# Patient Record
Sex: Male | Born: 1935 | Race: White | Hispanic: No | Smoking: Former smoker
Health system: Southern US, Community
[De-identification: ages and names within clinical notes are randomized; demographics above are authoritative.]

## PROBLEM LIST (undated history)

## (undated) DIAGNOSIS — I5030 Unspecified diastolic (congestive) heart failure: Secondary | ICD-10-CM

## (undated) DIAGNOSIS — E785 Hyperlipidemia, unspecified: Secondary | ICD-10-CM

## (undated) DIAGNOSIS — I219 Acute myocardial infarction, unspecified: Secondary | ICD-10-CM

## (undated) DIAGNOSIS — I10 Essential (primary) hypertension: Secondary | ICD-10-CM

## (undated) DIAGNOSIS — I2510 Atherosclerotic heart disease of native coronary artery without angina pectoris: Secondary | ICD-10-CM

## (undated) DIAGNOSIS — I259 Chronic ischemic heart disease, unspecified: Secondary | ICD-10-CM

## (undated) DIAGNOSIS — I779 Disorder of arteries and arterioles, unspecified: Secondary | ICD-10-CM

## (undated) DIAGNOSIS — K449 Diaphragmatic hernia without obstruction or gangrene: Secondary | ICD-10-CM

## (undated) HISTORY — DX: Unspecified diastolic (congestive) heart failure: I50.30

## (undated) HISTORY — DX: Essential (primary) hypertension: I10

## (undated) HISTORY — DX: Diaphragmatic hernia without obstruction or gangrene: K44.9

## (undated) HISTORY — PX: CORONARY ANGIOPLASTY WITH STENT PLACEMENT: SHX49

## (undated) HISTORY — DX: Hypercalcemia: E83.52

## (undated) HISTORY — PX: CARDIAC CATHETERIZATION: SHX172

## (undated) HISTORY — DX: Acute myocardial infarction, unspecified: I21.9

## (undated) HISTORY — DX: Atherosclerotic heart disease of native coronary artery without angina pectoris: I25.10

## (undated) HISTORY — DX: Chronic ischemic heart disease, unspecified: I25.9

## (undated) HISTORY — DX: Hyperlipidemia, unspecified: E78.5

---

## 2008-04-09 ENCOUNTER — Ambulatory Visit: Payer: Self-pay | Admitting: Internal Medicine

## 2014-04-15 DIAGNOSIS — I10 Essential (primary) hypertension: Secondary | ICD-10-CM | POA: Diagnosis not present

## 2014-04-15 DIAGNOSIS — E785 Hyperlipidemia, unspecified: Secondary | ICD-10-CM | POA: Diagnosis not present

## 2014-04-15 DIAGNOSIS — I214 Non-ST elevation (NSTEMI) myocardial infarction: Secondary | ICD-10-CM | POA: Diagnosis not present

## 2014-10-22 DIAGNOSIS — E785 Hyperlipidemia, unspecified: Secondary | ICD-10-CM | POA: Diagnosis not present

## 2014-10-22 DIAGNOSIS — I259 Chronic ischemic heart disease, unspecified: Secondary | ICD-10-CM | POA: Diagnosis not present

## 2014-10-22 DIAGNOSIS — I10 Essential (primary) hypertension: Secondary | ICD-10-CM | POA: Diagnosis not present

## 2014-10-29 DIAGNOSIS — L988 Other specified disorders of the skin and subcutaneous tissue: Secondary | ICD-10-CM | POA: Diagnosis not present

## 2014-11-10 DIAGNOSIS — D2262 Melanocytic nevi of left upper limb, including shoulder: Secondary | ICD-10-CM | POA: Diagnosis not present

## 2014-11-10 DIAGNOSIS — D2261 Melanocytic nevi of right upper limb, including shoulder: Secondary | ICD-10-CM | POA: Diagnosis not present

## 2014-11-10 DIAGNOSIS — D485 Neoplasm of uncertain behavior of skin: Secondary | ICD-10-CM | POA: Diagnosis not present

## 2014-11-10 DIAGNOSIS — D225 Melanocytic nevi of trunk: Secondary | ICD-10-CM | POA: Diagnosis not present

## 2014-11-10 DIAGNOSIS — C44519 Basal cell carcinoma of skin of other part of trunk: Secondary | ICD-10-CM | POA: Diagnosis not present

## 2014-11-13 ENCOUNTER — Ambulatory Visit (INDEPENDENT_AMBULATORY_CARE_PROVIDER_SITE_OTHER): Payer: Medicare Other | Admitting: Cardiovascular Disease

## 2014-11-13 ENCOUNTER — Encounter: Payer: Self-pay | Admitting: Cardiovascular Disease

## 2014-11-13 ENCOUNTER — Encounter (INDEPENDENT_AMBULATORY_CARE_PROVIDER_SITE_OTHER): Payer: Self-pay

## 2014-11-13 VITALS — BP 150/60 | HR 60 | Ht 66.0 in | Wt 173.5 lb

## 2014-11-13 DIAGNOSIS — R0989 Other specified symptoms and signs involving the circulatory and respiratory systems: Secondary | ICD-10-CM | POA: Diagnosis not present

## 2014-11-13 DIAGNOSIS — R079 Chest pain, unspecified: Secondary | ICD-10-CM | POA: Diagnosis not present

## 2014-11-13 DIAGNOSIS — I25119 Atherosclerotic heart disease of native coronary artery with unspecified angina pectoris: Secondary | ICD-10-CM | POA: Diagnosis not present

## 2014-11-13 DIAGNOSIS — I10 Essential (primary) hypertension: Secondary | ICD-10-CM | POA: Insufficient documentation

## 2014-11-13 DIAGNOSIS — E785 Hyperlipidemia, unspecified: Secondary | ICD-10-CM | POA: Insufficient documentation

## 2014-11-13 NOTE — Patient Instructions (Addendum)
Medication Instructions:  Your physician recommends that you continue on your current medications as directed. Please refer to the Current Medication list given to you today.   Labwork: none  Testing/Procedures: Your physician has requested that you have a carotid duplex. This test is an ultrasound of the carotid arteries in your neck. It looks at blood flow through these arteries that supply the brain with blood. Allow one hour for this exam. There are no restrictions or special instructions.    Follow-Up: Your physician wants you to follow-up in: six months with Dr. Arida.  You will receive a reminder letter in the mail two months in advance. If you don't receive a letter, please call our office to schedule the follow-up appointment.   Any Other Special Instructions Will Be Listed Below (If Applicable).   

## 2014-11-13 NOTE — Assessment & Plan Note (Signed)
Most recent lipid profile showed a total cholesterol of 159, HDL of 65, triglyceride of 83 and an LDL of 77. Continue treatment with simvastatin.

## 2014-11-13 NOTE — Progress Notes (Signed)
Primary care physician: Dr. Ola Spurr  HPI  This is a pleasant 81 year old male who is here today to establish cardiovascular care. He use to follow-up with Dr. Nehemiah Massed in the past. Records are not available through care everywhere. He has extensive history of ischemic heart disease with previous myocardial infarction at the age of 58. He reports having multiple PCI's done. Most recent cardiac cath that I have on record is from February 2010 which showed mild LAD disease, occluded mid left circumflex with good collaterals, and 85% stenosis in the distal RCA. Ejection fraction was mildly reduced. He underwent successful angioplasty on the distal right coronary artery with placement of a 3.0 x 18 mm vision bare-metal stent. He currently reports stable rare episodes of chest and jaw pain with activities. There has been no recent worsening. He has been somewhat depressed since the death of his wife last year. He has extensive family history of coronary artery disease. He quit smoking at the age of 32 after his myocardial infarction.  Not on File   No current outpatient prescriptions on file prior to visit.   No current facility-administered medications on file prior to visit.     Past Medical History  Diagnosis Date  . Hypertension   . Ischemic heart disease   . Hypercalcemia   . Hiatal hernia   . MI (myocardial infarction)   . Coronary artery disease     He reports having myocardial infarction at the age of 34. He reports having multiple PCI's most recently about 10 years ago. These records are not available.  . Hyperlipidemia   . Essential hypertension      Past Surgical History  Procedure Laterality Date  . Cardiac catheterization      [facility]  . Coronary angioplasty with stent placement       Family History  Problem Relation Age of Onset  . Hypertension Father      Social History   Social History  . Marital Status: Married    Spouse Name: N/A  . Number of Children:  N/A  . Years of Education: N/A   Occupational History  . Not on file.   Social History Main Topics  . Smoking status: Former Smoker    Types: Cigarettes    Quit date: 03/25/1982  . Smokeless tobacco: Not on file  . Alcohol Use: Yes     Comment: occas.   . Drug Use: No  . Sexual Activity: Not on file   Other Topics Concern  . Not on file   Social History Narrative  . No narrative on file     ROS A 10 point review of system was performed. It is negative other than that mentioned in the history of present illness.   PHYSICAL EXAM   BP 150/60 mmHg  Pulse 60  Ht 5\' 6"  (1.676 m)  Wt 173 lb 8 oz (78.699 kg)  BMI 28.02 kg/m2 Constitutional: He is oriented to person, place, and time. He appears well-developed and well-nourished. No distress.  HENT: No nasal discharge.  Head: Normocephalic and atraumatic.  Eyes: Pupils are equal and round.  No discharge. Neck: Normal range of motion. Neck supple. No JVD present. No thyromegaly present. Right carotid bruit Cardiovascular: Normal rate, regular rhythm, normal heart sounds. Exam reveals no gallop and no friction rub. No murmur heard.  Pulmonary/Chest: Effort normal and breath sounds normal. No stridor. No respiratory distress. He has no wheezes. He has no rales. He exhibits no tenderness.  Abdominal: Soft. Bowel sounds  are normal. He exhibits no distension. There is no tenderness. There is no rebound and no guarding.  Musculoskeletal: Normal range of motion. He exhibits no edema and no tenderness.  Neurological: He is alert and oriented to person, place, and time. Coordination normal.  Skin: Skin is warm and dry. No rash noted. He is not diaphoretic. No erythema. No pallor.  Psychiatric: He has a normal mood and affect. His behavior is normal. Judgment and thought content normal.       EKG: Normal sinus rhythm with sinus arrhythmia and borderline LVH.   ASSESSMENT AND PLAN

## 2014-11-13 NOTE — Assessment & Plan Note (Signed)
Blood pressure is mildly elevated. I might consider switching atenolol to carvedilol.

## 2014-11-13 NOTE — Assessment & Plan Note (Signed)
The patient has known history of coronary artery disease currently with very mild angina which is being managed medically. Given that he had no recent worsening of his symptoms, I recommend continued medical therapy.

## 2014-11-13 NOTE — Assessment & Plan Note (Signed)
He reports known history of carotid disease with no recent evaluation. I requested carotid Doppler.

## 2014-11-22 DIAGNOSIS — Z23 Encounter for immunization: Secondary | ICD-10-CM | POA: Diagnosis not present

## 2014-11-25 ENCOUNTER — Ambulatory Visit (INDEPENDENT_AMBULATORY_CARE_PROVIDER_SITE_OTHER): Payer: Medicare Other

## 2014-11-25 DIAGNOSIS — R0989 Other specified symptoms and signs involving the circulatory and respiratory systems: Secondary | ICD-10-CM | POA: Diagnosis not present

## 2014-11-26 ENCOUNTER — Other Ambulatory Visit: Payer: Self-pay

## 2014-11-26 DIAGNOSIS — I6523 Occlusion and stenosis of bilateral carotid arteries: Secondary | ICD-10-CM

## 2014-12-01 DIAGNOSIS — L905 Scar conditions and fibrosis of skin: Secondary | ICD-10-CM | POA: Diagnosis not present

## 2014-12-01 DIAGNOSIS — C44519 Basal cell carcinoma of skin of other part of trunk: Secondary | ICD-10-CM | POA: Diagnosis not present

## 2015-01-05 DIAGNOSIS — J0110 Acute frontal sinusitis, unspecified: Secondary | ICD-10-CM | POA: Diagnosis not present

## 2015-01-05 DIAGNOSIS — R0982 Postnasal drip: Secondary | ICD-10-CM | POA: Diagnosis not present

## 2015-01-05 DIAGNOSIS — H66002 Acute suppurative otitis media without spontaneous rupture of ear drum, left ear: Secondary | ICD-10-CM | POA: Diagnosis not present

## 2015-04-27 DIAGNOSIS — I214 Non-ST elevation (NSTEMI) myocardial infarction: Secondary | ICD-10-CM | POA: Diagnosis not present

## 2015-04-27 DIAGNOSIS — I259 Chronic ischemic heart disease, unspecified: Secondary | ICD-10-CM | POA: Diagnosis not present

## 2015-04-27 DIAGNOSIS — E785 Hyperlipidemia, unspecified: Secondary | ICD-10-CM | POA: Diagnosis not present

## 2015-04-27 DIAGNOSIS — I10 Essential (primary) hypertension: Secondary | ICD-10-CM | POA: Diagnosis not present

## 2015-04-28 DIAGNOSIS — Z23 Encounter for immunization: Secondary | ICD-10-CM | POA: Diagnosis not present

## 2015-06-17 ENCOUNTER — Ambulatory Visit: Payer: Medicare Other

## 2015-06-17 DIAGNOSIS — I6523 Occlusion and stenosis of bilateral carotid arteries: Secondary | ICD-10-CM

## 2015-06-22 ENCOUNTER — Other Ambulatory Visit: Payer: Self-pay

## 2015-06-22 DIAGNOSIS — I6523 Occlusion and stenosis of bilateral carotid arteries: Secondary | ICD-10-CM

## 2015-07-16 ENCOUNTER — Encounter: Payer: Self-pay | Admitting: Cardiovascular Disease

## 2015-07-16 ENCOUNTER — Ambulatory Visit (INDEPENDENT_AMBULATORY_CARE_PROVIDER_SITE_OTHER): Payer: Medicare Other | Admitting: Cardiovascular Disease

## 2015-07-16 VITALS — BP 140/62 | HR 55 | Ht 66.0 in | Wt 174.8 lb

## 2015-07-16 DIAGNOSIS — I25119 Atherosclerotic heart disease of native coronary artery with unspecified angina pectoris: Secondary | ICD-10-CM

## 2015-07-16 DIAGNOSIS — I10 Essential (primary) hypertension: Secondary | ICD-10-CM | POA: Diagnosis not present

## 2015-07-16 DIAGNOSIS — I6523 Occlusion and stenosis of bilateral carotid arteries: Secondary | ICD-10-CM

## 2015-07-16 NOTE — Patient Instructions (Signed)
Medication Instructions: Continue same medications.   Labwork: None.   Procedures/Testing: None.   Follow-Up: 6 months with Dr. Arida.   Any Additional Special Instructions Will Be Listed Below (If Applicable).     If you need a refill on your cardiac medications before your next appointment, please call your pharmacy.   

## 2015-07-16 NOTE — Progress Notes (Signed)
Cardiology Office Note   Date:  07/16/2015   ID:  Anthony Johns, DOB 1927-04-22, MRN [id number]  PCP:  Anthony Ramsay, MD  Cardiologist:   Kathlyn Sacramento, MD   No chief complaint on file.     History of Present Illness: Anthony Johns is a 82 y.o. male who presents for a follow-up visit regarding coronary artery disease.  He has extensive history of ischemic heart disease with previous myocardial infarction at the age of 47. He had multiple PCI's done. Most recent cardiac cath that I have on record is from February 2010 which showed mild LAD disease, occluded mid left circumflex with good collaterals, and 85% stenosis in the distal RCA. Ejection fraction was mildly reduced. He underwent successful angioplasty on the distal right coronary artery with placement of a 3.0 x 18 mm vision bare-metal stent. He currently reports stable episodes of chest and jaw pain with activities. There has been no recent worsening. He has been somewhat depressed since the death of his wife last year. He has extensive family history of coronary artery disease. He quit smoking at the age of 78 after his myocardial infarction. During his last visit, he was noted to have right carotid bruit. Carotid Doppler showed 60-79% left ICA stenosis. No previous stroke.    Past Medical History  Diagnosis Date  . Hypertension   . Ischemic heart disease   . Hypercalcemia   . Hiatal hernia   . MI (myocardial infarction)   . Coronary artery disease     He reports having myocardial infarction at the age of 69. He reports having multiple PCI's most recently about 10 years ago. These records are not available.  . Hyperlipidemia   . Essential hypertension     Past Surgical History  Procedure Laterality Date  . Cardiac catheterization      [facility]  . Coronary angioplasty with stent placement       Current Outpatient Prescriptions  Medication Sig Dispense Refill  . amLODipine (NORVASC) 10 MG tablet Take 10 mg  by mouth daily.     Marland Kitchen aspirin EC 81 MG tablet Take 81 mg by mouth daily.     Marland Kitchen atenolol (TENORMIN) 25 MG tablet Take 25 mg by mouth daily.     . clopidogrel (PLAVIX) 75 MG tablet Take 75 mg by mouth daily.     Marland Kitchen losartan (COZAAR) 100 MG tablet Take 100 mg by mouth daily.     . magnesium oxide (MAG-OX) 400 MG tablet Take 400 mg by mouth daily.     . nitroGLYCERIN (NITROSTAT) 0.4 MG SL tablet Place 0.4 mg under the tongue every 5 (five) minutes as needed.     Marland Kitchen omeprazole (PRILOSEC) 20 MG capsule TAKE 1 CAPSULE DAILY    . simvastatin (ZOCOR) 20 MG tablet Take 20 mg by mouth daily at 6 PM.      No current facility-administered medications for this visit.    Allergies:   Review of patient's allergies indicates not on file.    Social History:  The patient  reports that he quit smoking about 33 years ago. His smoking use included Cigarettes. He does not have any smokeless tobacco history on file. He reports that he drinks alcohol. He reports that he does not use illicit drugs.   Family History:  The patient's family history includes Hypertension in his father.    ROS:  Please see the history of present illness.   Otherwise, review of systems are positive  for none.   All other systems are reviewed and negative.    PHYSICAL EXAM: VS:  There were no vitals taken for this visit. , BMI There is no weight on file to calculate BMI. GEN: Well nourished, well developed, in no acute distress HEENT: normal Neck: no JVD, or masses . Right carotid bruit. Cardiac: RRR; no  rubs, or gallops,no edema . One out of 6 systolic murmur at the left sternal border Respiratory:  clear to auscultation bilaterally, normal work of breathing GI: soft, nontender, nondistended, + BS MS: no deformity or atrophy Skin: warm and dry, no rash Neuro:  Strength and sensation are intact Psych: euthymic mood, full affect   EKG:  EKG is ordered today. The ekg ordered today demonstrates sinus bradycardia with  PAC   Recent Labs: No results found for requested labs within last 365 days.    Lipid Panel No results found for: CHOL, TRIG, HDL, CHOLHDL, VLDL, LDLCALC, LDLDIRECT    Wt Readings from Last 3 Encounters:  11/13/14 173 lb 8 oz (78.699 kg)         ASSESSMENT AND PLAN:  1.  Coronary artery disease involving native coronary arteries with stable angina: The patient continues to have stable class II angina with no recent worsening. Continue medical therapy.  2. Left carotid stenosis: Moderate. Repeat study and April 2018.  3. Essential hypertension: Blood pressure is mildly elevated. If bradycardia continues, we can consider switching atenolol to carvedilol.  4. Hyperlipidemia: Continue treatment with simvastatin. Most recent lipid profile in March showed an LDL of 72.   Disposition:   FU with me in 6 months  Signed,  Kathlyn Sacramento, MD  07/16/2015 2:04 PM    [place]

## 2015-07-24 ENCOUNTER — Emergency Department: Payer: Medicare Other

## 2015-07-24 ENCOUNTER — Emergency Department
Admission: EM | Admit: 2015-07-24 | Discharge: 2015-07-24 | Disposition: A | Discharge status: Home / Self Care | Payer: Medicare Other | Attending: Emergency Medicine | Admitting: Emergency Medicine

## 2015-07-24 ENCOUNTER — Encounter: Payer: Self-pay | Admitting: Emergency Medicine

## 2015-07-24 DIAGNOSIS — Z79899 Other long term (current) drug therapy: Secondary | ICD-10-CM | POA: Diagnosis not present

## 2015-07-24 DIAGNOSIS — S52135A Nondisplaced fracture of neck of left radius, initial encounter for closed fracture: Secondary | ICD-10-CM

## 2015-07-24 DIAGNOSIS — Y999 Unspecified external cause status: Secondary | ICD-10-CM | POA: Insufficient documentation

## 2015-07-24 DIAGNOSIS — I252 Old myocardial infarction: Secondary | ICD-10-CM | POA: Diagnosis not present

## 2015-07-24 DIAGNOSIS — S299XXA Unspecified injury of thorax, initial encounter: Secondary | ICD-10-CM | POA: Diagnosis not present

## 2015-07-24 DIAGNOSIS — Z8679 Personal history of other diseases of the circulatory system: Secondary | ICD-10-CM | POA: Diagnosis not present

## 2015-07-24 DIAGNOSIS — E785 Hyperlipidemia, unspecified: Secondary | ICD-10-CM | POA: Diagnosis not present

## 2015-07-24 DIAGNOSIS — Y939 Activity, unspecified: Secondary | ICD-10-CM | POA: Diagnosis not present

## 2015-07-24 DIAGNOSIS — Z7982 Long term (current) use of aspirin: Secondary | ICD-10-CM | POA: Diagnosis not present

## 2015-07-24 DIAGNOSIS — W010XXA Fall on same level from slipping, tripping and stumbling without subsequent striking against object, initial encounter: Secondary | ICD-10-CM | POA: Insufficient documentation

## 2015-07-24 DIAGNOSIS — S20212A Contusion of left front wall of thorax, initial encounter: Secondary | ICD-10-CM | POA: Diagnosis not present

## 2015-07-24 DIAGNOSIS — S6992XA Unspecified injury of left wrist, hand and finger(s), initial encounter: Secondary | ICD-10-CM | POA: Diagnosis present

## 2015-07-24 DIAGNOSIS — S52132A Displaced fracture of neck of left radius, initial encounter for closed fracture: Secondary | ICD-10-CM | POA: Diagnosis not present

## 2015-07-24 DIAGNOSIS — I10 Essential (primary) hypertension: Secondary | ICD-10-CM | POA: Diagnosis not present

## 2015-07-24 DIAGNOSIS — S5002XA Contusion of left elbow, initial encounter: Secondary | ICD-10-CM | POA: Diagnosis not present

## 2015-07-24 DIAGNOSIS — S5012XA Contusion of left forearm, initial encounter: Secondary | ICD-10-CM

## 2015-07-24 DIAGNOSIS — Y9289 Other specified places as the place of occurrence of the external cause: Secondary | ICD-10-CM | POA: Insufficient documentation

## 2015-07-24 DIAGNOSIS — I25119 Atherosclerotic heart disease of native coronary artery with unspecified angina pectoris: Secondary | ICD-10-CM | POA: Diagnosis not present

## 2015-07-24 DIAGNOSIS — M25512 Pain in left shoulder: Secondary | ICD-10-CM | POA: Diagnosis not present

## 2015-07-24 DIAGNOSIS — W19XXXA Unspecified fall, initial encounter: Secondary | ICD-10-CM

## 2015-07-24 MED ORDER — HYDROCODONE-ACETAMINOPHEN 5-325 MG PO TABS: 1.0000 | ORAL_TABLET | Freq: Four times a day (QID) | ORAL | Status: DC | PRN

## 2015-07-24 NOTE — ED Provider Notes (Signed)
[facility] Emergency Department Provider Note  ____________________________________________  Time seen: Approximately 4:43 PM  I have reviewed the triage vital signs and the nursing notes.   HISTORY  Chief Complaint Fall and Shoulder Pain    HPI Anthony Johns is a 82 y.o. male, NAD, who presents to the emergency department after a fall earlier today. Patient states that he was weed-eating on a hill around 11am today when he lost footing and fell onto his left shoulder and arm. Denies hitting head nor LOC. Admits to pain in left shoulder, upper arm, lower arm, and left chest wall as well as bruising of left arm. Denies active bleeding, any open wounds, chest pain, shortness of breath, or abdominal pain. States that the fall was mechanical in nature and denies any visual changes, numbness, weakness, tingling but contributed to the fall.   Past Medical History  Diagnosis Date  . Hypertension   . Ischemic heart disease   . Hypercalcemia   . Hiatal hernia   . MI (myocardial infarction) ([place])   . Coronary artery disease     He reports having myocardial infarction at the age of 13. He reports having multiple PCI's most recently about 10 years ago. These records are not available.  . Hyperlipidemia   . Essential hypertension     Patient Active Problem List   Diagnosis Date Noted  . Coronary artery disease involving native coronary artery with angina pectoris ([place]) 11/13/2014  . Right carotid bruit 11/13/2014  . Hyperlipidemia   . Essential hypertension     Past Surgical History  Procedure Laterality Date  . Cardiac catheterization      [facility]  . Coronary angioplasty with stent placement      Current Outpatient Rx  Name  Route  Sig  Dispense  Refill  . amLODipine (NORVASC) 10 MG tablet   Oral   Take 10 mg by mouth daily.          Marland Kitchen aspirin EC 81 MG tablet   Oral   Take 81 mg by mouth daily.          Marland Kitchen atenolol (TENORMIN) 25 MG tablet    Oral   Take 25 mg by mouth daily.          . clopidogrel (PLAVIX) 75 MG tablet   Oral   Take 75 mg by mouth daily.          Marland Kitchen HYDROcodone-acetaminophen (NORCO) 5-325 MG tablet   Oral   Take 1 tablet by mouth every 6 (six) hours as needed for severe pain.   12 tablet   0   . losartan (COZAAR) 100 MG tablet   Oral   Take 100 mg by mouth daily.          . magnesium oxide (MAG-OX) 400 MG tablet   Oral   Take 400 mg by mouth daily.          . nitroGLYCERIN (NITROSTAT) 0.4 MG SL tablet   Sublingual   Place 0.4 mg under the tongue every 5 (five) minutes as needed.          Marland Kitchen omeprazole (PRILOSEC) 20 MG capsule      TAKE 1 CAPSULE DAILY         . simvastatin (ZOCOR) 20 MG tablet   Oral   Take 20 mg by mouth daily at 6 PM.            Allergies Review of patient's allergies indicates no known allergies.  Family History  Problem Relation Age of Onset  . Hypertension Father     Social History Social History  Substance Use Topics  . Smoking status: Former Smoker    Types: Cigarettes    Quit date: 03/25/1982  . Smokeless tobacco: None  . Alcohol Use: Yes     Comment: occas.      Review of Systems  Constitutional: No fever/chills, fatigue Eyes: No visual changes. Cardiovascular: No chest pain. Respiratory:  No cough. No shortness of breath. No wheezing.  Gastrointestinal: No abdominal pain.  No nausea, vomiting.   Musculoskeletal: Pain at left shoulder, left upper arm, left lower arm, left hand, and left chest wall.  Negative for back, neck pain.  Skin: Bruising about left elbow and forearm. Negative for rash, redness, swelling, open wounds, lacerations or skin sores. Neurological: Negative for headaches, focal weakness or numbness. 10-point ROS otherwise negative.  ____________________________________________   PHYSICAL EXAM:  VITAL SIGNS: ED Triage Vitals  Enc Vitals Group     BP 07/24/15 1547 141/80 mmHg     Pulse Rate 07/24/15 1547 55      Resp 07/24/15 1547 18     Temp 07/24/15 1547 98.4 F (36.9 C)     Temp Source 07/24/15 1547 Oral     SpO2 07/24/15 1547 99 %     Weight 07/24/15 1547 174 lb (78.926 kg)     Height 07/24/15 1547 5\' 6"  (1.676 m)     Head Cir --      Peak Flow --      Pain Score 07/24/15 1546 8     Pain Loc --      Pain Edu? --      Excl. in [place]? --      Constitutional: Alert and oriented. Well appearing and in no acute distress. Eyes: Conjunctivae are normal. Head: Atraumatic. Neck: Supple with full range of motion. Hematological/Lymphatic/Immunilogical: No cervical lymphadenopathy. Cardiovascular: Normal rate, regular rhythm. Grossly normal heart sounds. Good peripheral circulation with 2+ pulses noted in the left upper extremity. Respiratory: Normal respiratory effort without tachypnea or retractions. Lungs CTAB with breath sounds noted in all lung fields. Musculoskeletal: Mild tenderness to palpation over the left anterior chest lateral to the nipple line. Patient with decreased ability to supinate the left forearm and complains of pain at the proximal forearm. Patient is able to fully pronate the left forearm without pain. Patient is able to fully flex the left elbow without pain but is unable to fully extend the elbow without pain. Full range of motion of left shoulder without pain.  Neurologic:  Normal speech and language. No gross focal neurologic deficits are appreciated. Gait and posture are normal. Skin:  Diffuse bruising is noted about the left elbow and proximal left forearm without any open wounds or lesions. Skin is warm, dry and intact. No rash noted. Psychiatric: Mood and affect are normal. Speech and behavior are normal. Patient exhibits appropriate insight and judgement.   ____________________________________________   LABS  None ____________________________________________  EKG  None ____________________________________________  RADIOLOGY I have personally viewed and  evaluated these images (plain radiographs) as part of my medical decision making, as well as reviewing the written report by the radiologist.  Dg Ribs Unilateral W/chest Left  07/24/2015  CLINICAL DATA:  Fall. EXAM: LEFT RIBS AND CHEST - 3+ VIEW COMPARISON:  None FINDINGS: No fracture or other bone lesions are seen involving the ribs. There is no evidence of pneumothorax or pleural effusion. Both lungs are clear. Heart  size and mediastinal contours are within normal limits. IMPRESSION: Negative. Electronically Signed   By: Kerby Moors M.D.   On: 07/24/2015 17:40   Dg Elbow 2 Views Left  07/24/2015  CLINICAL DATA:  Patient reports he fell today onto his left side while doing yard work. Pain in left shoulder that radiates distally into distal forearm. No previous injuries reported. EXAM: LEFT ELBOW - 2 VIEW COMPARISON:  None. FINDINGS: There is a fracture through the left radial neck. Associated joint effusion. No subluxation or dislocation. No additional acute bony abnormality. IMPRESSION: Left radial neck fracture. Electronically Signed   By: Rolm Baptise M.D.   On: 07/24/2015 17:38   Dg Forearm Left  07/24/2015  CLINICAL DATA:  Patient reports he fell today onto his left side while doing yard work. Pain in left shoulder that radiates distally into distal forearm. No previous injuries reported. EXAM: LEFT FOREARM - 2 VIEW COMPARISON:  None. FINDINGS: Fracture again noted through the left radial neck. This is better seen on the elbow series. Associated left elbow joint effusion. No additional acute bony abnormality. IMPRESSION: Left radial neck fracture. Electronically Signed   By: Rolm Baptise M.D.   On: 07/24/2015 17:41   Dg Shoulder Left  07/24/2015  CLINICAL DATA:  Fall today onto left side doing yard work. Left shoulder pain. EXAM: LEFT SHOULDER - 2+ VIEW COMPARISON:  None FINDINGS: Mild degenerative changes in the left AC joint. Glenohumeral joint is intact. No acute bony abnormality. Specifically,  no fracture, subluxation, or dislocation. Soft tissues are intact. IMPRESSION: No acute bony abnormality. Electronically Signed   By: Rolm Baptise M.D.   On: 07/24/2015 17:38    ____________________________________________    PROCEDURES  Procedure(s) performed: None    Medications - No data to display   ____________________________________________   INITIAL IMPRESSION / ASSESSMENT AND PLAN / ED COURSE  Pertinent imaging results that were available during my care of the patient were reviewed by me and considered in my medical decision making (see chart for details).  Patient's diagnosis is consistent with a closed nondisplaced fracture of the neck of the left radius, contusion of left elbow and forearm, chest wall contusion due to fall. Patient was placed in a sugar tong splint and arm sling for comfort care. Patient will be discharged home with prescriptions for Norco to use as needed for severe pain. Patient is to follow up with Dr. Sabra Heck in orthopedics in 3 days for recheck. Patient is given ED precautions to return to the ED for any worsening or new symptoms.    ____________________________________________  FINAL CLINICAL IMPRESSION(S) / ED DIAGNOSES  Final diagnoses:  Closed nondisplaced fracture of neck of left radius, initial encounter  Contusion of left elbow and forearm, initial encounter  Fall, initial encounter  Chest wall contusion, left, initial encounter      NEW MEDICATIONS STARTED DURING THIS VISIT:  New Prescriptions   HYDROCODONE-ACETAMINOPHEN (NORCO) 5-325 MG TABLET    Take 1 tablet by mouth every 6 (six) hours as needed for severe pain.         Braxton Feathers, PA-C 07/24/15 1803  Lisa Roca, MD 07/24/15 1806

## 2015-07-24 NOTE — ED Notes (Signed)
See triage note  States he fell   Landed on left shoulder this am  Having pain to left shoulder and upper chest .Also, he has additional complaints of pain to left wrist area  No swelling noted but having increased pain with movement.  Positive pulses and good sensation

## 2015-07-24 NOTE — Discharge Instructions (Signed)
Chest Contusion A chest contusion is a deep bruise on your chest area. Contusions are the result of an injury that caused bleeding under the skin. A chest contusion may involve bruising of the skin, muscles, or ribs. The contusion may turn blue, purple, or yellow. Minor injuries will give you a painless contusion, but more severe contusions may stay painful and swollen for a few weeks. CAUSES  A contusion is usually caused by a blow, trauma, or direct force to an area of the body. SYMPTOMS   Swelling and redness of the injured area.  Discoloration of the injured area.  Tenderness and soreness of the injured area.  Pain. DIAGNOSIS  The diagnosis can be made by taking a history and performing a physical exam. An X-ray, CT scan, or MRI may be needed to determine if there were any associated injuries, such as broken bones (fractures) or internal injuries. TREATMENT  Often, the best treatment for a chest contusion is resting, icing, and applying cold compresses to the injured area. Deep breathing exercises may be recommended to reduce the risk of pneumonia. Over-the-counter medicines may also be recommended for pain control. HOME CARE INSTRUCTIONS   Put ice on the injured area.  Put ice in a plastic bag.  Place a towel between your skin and the bag.  Leave the ice on for 15-20 minutes, 03-04 times a day.  Only take over-the-counter or prescription medicines as directed by your caregiver. Your caregiver may recommend avoiding anti-inflammatory medicines (aspirin, ibuprofen, and naproxen) for 48 hours because these medicines may increase bruising.  Rest the injured area.  Perform deep-breathing exercises as directed by your caregiver.  Stop smoking if you smoke.  Do not lift objects over 5 pounds (2.3 kg) for 3 days or longer if recommended by your caregiver. SEEK IMMEDIATE MEDICAL CARE IF:   You have increased bruising or swelling.  You have pain that is getting worse.  You have  difficulty breathing.  You have dizziness, weakness, or fainting.  You have blood in your urine or stool.  You cough up or vomit blood.  Your swelling or pain is not relieved with medicines. MAKE SURE YOU:   Understand these instructions.  Will watch your condition.  Will get help right away if you are not doing well or get worse.   This information is not intended to replace advice given to you by your health care provider. Make sure you discuss any questions you have with your health care provider.   Document Released: 11/02/2000 Document Revised: 11/02/2011 Document Reviewed: 08/01/2011 Elsevier Interactive Patient Education 2016 [employer].  Cryotherapy Cryotherapy is when you put ice on your injury. Ice helps lessen pain and puffiness (swelling) after an injury. Ice works the best when you start using it in the first 24 to 48 hours after an injury. HOME CARE  Put a dry or damp towel between the ice pack and your skin.  You may press gently on the ice pack.  Leave the ice on for no more than 10 to 20 minutes at a time.  Check your skin after 5 minutes to make sure your skin is okay.  Rest at least 20 minutes between ice pack uses.  Stop using ice when your skin loses feeling (numbness).  Do not use ice on someone who cannot tell you when it hurts. This includes small children and people with memory problems (dementia). GET HELP RIGHT AWAY IF:  You have white spots on your skin.  Your skin  turns blue or pale.  Your skin feels waxy or hard.  Your puffiness gets worse. MAKE SURE YOU:   Understand these instructions.  Will watch your condition.  Will get help right away if you are not doing well or get worse.   This information is not intended to replace advice given to you by your health care provider. Make sure you discuss any questions you have with your health care provider.   Document Released: 07/27/2007 Document Revised: 05/02/2011 Document  Reviewed: 09/30/2010 Elsevier Interactive Patient Education 2016 [employer]  Radial Fracture A radial fracture is a break in the radius bone, which is the long bone of the forearm that is on the same side as your thumb. Your forearm is the part of your arm that is between your elbow and your wrist. It is made up of two bones: the radius and the ulna. Most radial fractures occur near the wrist (distal radialfracture) or near the elbow (radial head fracture). A distal radial fracture is the most common type of broken arm. This fracture usually occurs about an inch above the wrist. Fractures of the middle part of the bone are less common. CAUSES  Falling with your arm outstretched is the most common cause of a radial fracture. Other causes include:  Car accidents.  Bike accidents.  A direct blow to the middle part of the radius. RISK FACTORS  You may be at greater risk for a distal radial fracture if you are 32 years of age or older.  You may be at greater risk for a radial head fracture if you are:  Male.  5-4 years old.  You may be at a greater risk for all types of radial fractures if you have a condition that causes your bones to be weak or thin (osteoporosis). SIGNS AND SYMPTOMS A radial fracture causes pain immediately after the injury. Other signs and symptoms include:  An abnormal bend or bump in your arm (deformity).  Swelling.  Bruising.  Numbness or tingling.  Tenderness.  Limited movement. DIAGNOSIS  Your health care provider may diagnose a radial fracture based on:  Your symptoms.  Your medical history, including any recent injury.  A physical exam. Your health care provider will look for any deformity and feel for tenderness over the break. Your health care provider will also check whether the bone is out of place.  An X-ray exam to confirm the diagnosis and learn more about the type of fracture. TREATMENT The goals of treatment are to get the  bone in proper position for healing and to keep it from moving so it will heal over time. Your treatment will depend on many factors, especially the type of fracture that you have.  If the fractured bone:  Is in the correct position (nondisplaced), you may only need to wear a cast or a splint.  Has a slightly displaced fracture, you may need to have the bones moved back into place manually (closed reduction) before the splint or cast is put on.  You may have a temporary splint before you have a plaster cast. The splint allows room for some swelling. After a few days, a cast can replace the splint.  You may have to wear the cast for about 6 weeks or as directed by your health care provider.  The cast may be changed after about 3 weeks or as directed by your health care provider.  After your cast is taken off, you may need physical therapy to regain  full movement in your wrist or elbow.  You may need emergency surgery if you have:  A fractured bone that is out of position (displaced).  A fracture with multiple fragments (comminuted fracture).  A fracture that breaks the skin (open fracture). This type of fracture may require surgical wires, plates, or screws to hold the bone in place.  You may have X-rays every couple of weeks to check on your healing. HOME CARE INSTRUCTIONS  Keep the injured arm above the level of your heart while you are sitting or lying down. This helps to reduce swelling and pain.  Apply ice to the injured area:  Put ice in a plastic bag.  Place a towel between your skin and the bag.  Leave the ice on for 20 minutes, 2-3 times per day.  Move your fingers often to avoid stiffness and to minimize swelling.  If you have a plaster or fiberglass cast:  Do not try to scratch the skin under the cast using sharp or pointed objects.  Check the skin around the cast every day. You may put lotion on any red or sore areas.  Keep your cast dry and clean.  If you  have a plaster splint:  Wear the splint as directed.  Loosen the elastic around the splint if your fingers become numb and tingle, or if they turn cold and blue.  Do not put pressure on any part of your cast until it is fully hardened. Rest your cast only on a pillow for the first 24 hours.  Protect your cast or splint while bathing or showering, as directed by your health care provider. Do not put your cast or splint into water.  Take medicines only as directed by your health care provider.  Return to activities, such as sports, as directed by your health care provider. Ask your health care provider what activities are safe for you.  Keep all follow-up visits as directed by your health care provider. This is important. SEEK MEDICAL CARE IF:  Your pain medicine is not helping.  Your cast gets damaged or it breaks.  Your cast becomes loose.  Your cast gets wet.  You have more severe pain or swelling than you did before the cast.  You have severe pain when stretching your fingers.  You continue to have pain or stiffness in your elbow or your wrist after your cast is taken off. SEEK IMMEDIATE MEDICAL CARE IF:  You cannot move your fingers.  You lose feeling in your fingers or your hand.  Your hand or your fingers turn cold and pale or blue.  You notice a bad smell coming from your cast.  You have drainage from underneath your cast.  You have new stains from blood or drainage seeping through your cast.   This information is not intended to replace advice given to you by your health care provider. Make sure you discuss any questions you have with your health care provider.   Document Released: 07/21/2005 Document Revised: 02/28/2014 Document Reviewed: 08/02/2013 Elsevier Interactive Patient Education [employer].

## 2015-07-24 NOTE — ED Notes (Addendum)
Pt states he was weed eating on a hill today when he tripped on the ground and fell onto left shoulder. Denies hitting his head and did not have any LOC.  Pt does have bruising noted to both arms and hands.  Pt is on Plavix and ASA daily. No active bleeding at this time. AMbulatory in triage without difficulty. Injury happened around 1130 today Pt also c/o left wrist pain and decreased ROM.

## 2015-07-27 DIAGNOSIS — S52135A Nondisplaced fracture of neck of left radius, initial encounter for closed fracture: Secondary | ICD-10-CM | POA: Diagnosis not present

## 2015-08-10 DIAGNOSIS — R0781 Pleurodynia: Secondary | ICD-10-CM | POA: Diagnosis not present

## 2015-08-10 DIAGNOSIS — M25522 Pain in left elbow: Secondary | ICD-10-CM | POA: Diagnosis not present

## 2015-11-02 DIAGNOSIS — K582 Mixed irritable bowel syndrome: Secondary | ICD-10-CM | POA: Diagnosis not present

## 2015-11-02 DIAGNOSIS — I259 Chronic ischemic heart disease, unspecified: Secondary | ICD-10-CM | POA: Diagnosis not present

## 2015-11-02 DIAGNOSIS — I214 Non-ST elevation (NSTEMI) myocardial infarction: Secondary | ICD-10-CM | POA: Diagnosis not present

## 2015-11-02 DIAGNOSIS — I10 Essential (primary) hypertension: Secondary | ICD-10-CM | POA: Diagnosis not present

## 2015-11-02 DIAGNOSIS — E785 Hyperlipidemia, unspecified: Secondary | ICD-10-CM | POA: Diagnosis not present

## 2015-12-01 DIAGNOSIS — Z23 Encounter for immunization: Secondary | ICD-10-CM | POA: Diagnosis not present

## 2015-12-14 DIAGNOSIS — Z961 Presence of intraocular lens: Secondary | ICD-10-CM | POA: Diagnosis not present

## 2016-01-12 ENCOUNTER — Ambulatory Visit (INDEPENDENT_AMBULATORY_CARE_PROVIDER_SITE_OTHER): Payer: Medicare Other | Admitting: Cardiovascular Disease

## 2016-01-12 ENCOUNTER — Encounter: Payer: Self-pay | Admitting: Cardiovascular Disease

## 2016-01-12 VITALS — BP 160/70 | HR 62 | Ht 66.0 in | Wt 179.5 lb

## 2016-01-12 DIAGNOSIS — I25119 Atherosclerotic heart disease of native coronary artery with unspecified angina pectoris: Secondary | ICD-10-CM

## 2016-01-12 DIAGNOSIS — E7800 Pure hypercholesterolemia, unspecified: Secondary | ICD-10-CM

## 2016-01-12 DIAGNOSIS — I209 Angina pectoris, unspecified: Secondary | ICD-10-CM

## 2016-01-12 DIAGNOSIS — I6523 Occlusion and stenosis of bilateral carotid arteries: Secondary | ICD-10-CM

## 2016-01-12 DIAGNOSIS — I10 Essential (primary) hypertension: Secondary | ICD-10-CM | POA: Diagnosis not present

## 2016-01-12 MED ORDER — CARVEDILOL 6.25 MG PO TABS: 6.2500 mg | ORAL_TABLET | Freq: Two times a day (BID) | ORAL | 2 refills | Status: DC

## 2016-01-12 NOTE — Patient Instructions (Signed)
Medication Instructions:  Your physician has recommended you make the following change in your medication:  STOP taking atenolol START taking coreg 6.25mg  twice daily   Labwork: none  Testing/Procedures: none  Follow-Up: Your physician wants you to follow-up in: 6 months with Dr. Fletcher Anon.  You will receive a reminder letter in the mail two months in advance. If you don't receive a letter, please call our office to schedule the follow-up appointment.   Any Other Special Instructions Will Be Listed Below (If Applicable).     If you need a refill on your cardiac medications before your next appointment, please call your pharmacy.

## 2016-01-12 NOTE — Progress Notes (Signed)
Cardiology Office Note   Date:  01/12/2016   ID:  Anthony Johns, DOB 10/14/1927, MRN [id number]  PCP:  Leonel Ramsay, MD  Cardiologist:   Kathlyn Sacramento, MD   Chief Complaint  Patient presents with  . other    6 month follow up. Meds reviewed by the pt. verbally. "doing well."       History of Present Illness: Anthony Johns is a 82 y.o. male who presents for a follow-up visit regarding coronary artery disease.  He has extensive history of ischemic heart disease with previous myocardial infarction at the age of 60. He had multiple PCI's done. Most recent cardiac cath that I have on record is from February 2010 which showed mild LAD disease, occluded mid left circumflex with good collaterals, and 85% stenosis in the distal RCA. Ejection fraction was mildly reduced. He underwent successful angioplasty on the distal right coronary artery with placement of a 3.0 x 18 mm vision bare-metal stent. He currently reports stable episodes of chest and jaw pain with activities. There has been no recent worsening.  He has known left carotid stenosis. His wife died in 06/06/2014. He reports stable symptoms overall with no change from his most recent visit. He is more aware of chest pain in the cold weather.   Past Medical History:  Diagnosis Date  . Coronary artery disease    He reports having myocardial infarction at the age of 76. He reports having multiple PCI's most recently about 10 years ago. These records are not available.  . Essential hypertension   . Hiatal hernia   . Hypercalcemia   . Hyperlipidemia   . Hypertension   . Ischemic heart disease   . MI (myocardial infarction)     Past Surgical History:  Procedure Laterality Date  . CARDIAC CATHETERIZATION     [facility]  . CORONARY ANGIOPLASTY WITH STENT PLACEMENT       Current Outpatient Prescriptions  Medication Sig Dispense Refill  . amLODipine (NORVASC) 10 MG tablet Take 10 mg by mouth daily.     Marland Kitchen aspirin EC 81 MG  tablet Take 81 mg by mouth daily.     Marland Kitchen atenolol (TENORMIN) 25 MG tablet Take 25 mg by mouth daily.     . clopidogrel (PLAVIX) 75 MG tablet Take 75 mg by mouth daily.     Marland Kitchen HYDROcodone-acetaminophen (NORCO) 5-325 MG tablet Take 1 tablet by mouth every 6 (six) hours as needed for severe pain. 12 tablet 0  . losartan (COZAAR) 100 MG tablet Take 100 mg by mouth daily.     . magnesium oxide (MAG-OX) 400 MG tablet Take 400 mg by mouth daily.     . nitroGLYCERIN (NITROSTAT) 0.4 MG SL tablet Place 0.4 mg under the tongue every 5 (five) minutes as needed.     Marland Kitchen omeprazole (PRILOSEC) 20 MG capsule TAKE 1 CAPSULE DAILY    . simvastatin (ZOCOR) 20 MG tablet Take 20 mg by mouth daily at 6 PM.      No current facility-administered medications for this visit.     Allergies:   Patient has no known allergies.    Social History:  The patient  reports that he quit smoking about 33 years ago. His smoking use included Cigarettes. He has never used smokeless tobacco. He reports that he drinks alcohol. He reports that he does not use drugs.   Family History:  The patient's family history includes Hypertension in his father.    ROS:  Please see the history of present illness.   Otherwise, review of systems are positive for none.   All other systems are reviewed and negative.    PHYSICAL EXAM: VS:  BP (!) 160/70 (BP Location: Left Arm, Patient Position: Sitting, Cuff Size: Normal)   Pulse 62   Ht 5\' 6"  (1.676 m)   Wt 179 lb 8 oz (81.4 kg)   BMI 28.97 kg/m  , BMI Body mass index is 28.97 kg/m. GEN: Well nourished, well developed, in no acute distress  HEENT: normal  Neck: no JVD, or masses . Right carotid bruit. Cardiac: RRR; no  rubs, or gallops,no edema . 1 / 6 systolic murmur at the left sternal border Respiratory:  clear to auscultation bilaterally, normal work of breathing GI: soft, nontender, nondistended, + BS MS: no deformity or atrophy  Skin: warm and dry, no rash Neuro:  Strength and  sensation are intact Psych: euthymic mood, full affect   EKG:  EKG is ordered today. The ekg ordered today demonstrates normal sinus rhythm with  sinus arrhythmia   Recent Labs: No results found for requested labs within last 8760 hours.    Lipid Panel No results found for: CHOL, TRIG, HDL, CHOLHDL, VLDL, LDLCALC, LDLDIRECT    Wt Readings from Last 3 Encounters:  01/12/16 179 lb 8 oz (81.4 kg)  07/24/15 174 lb (78.9 kg)  07/16/15 174 lb 12 oz (79.3 kg)         ASSESSMENT AND PLAN:  1.  Coronary artery disease involving native coronary arteries with stable angina: The patient continues to have stable class II angina with no recent worsening. Continue medical therapy.  2. Left carotid stenosis: Moderate. Repeat study and April 2018.  3. Essential hypertension:  Blood pressure continues to be elevated. I elected to switch atenolol to carvedilol 6.25 mg twice daily.  4. Hyperlipidemia: Continue treatment with simvastatin. Most recent lipid profile in March showed an LDL of 72.   Disposition:   FU with me in 6 months  Signed,  Kathlyn Sacramento, MD  01/12/2016 1:51 PM    [place]

## 2016-05-17 DIAGNOSIS — E785 Hyperlipidemia, unspecified: Secondary | ICD-10-CM | POA: Diagnosis not present

## 2016-05-17 DIAGNOSIS — K582 Mixed irritable bowel syndrome: Secondary | ICD-10-CM | POA: Diagnosis not present

## 2016-05-17 DIAGNOSIS — Z0000 Encounter for general adult medical examination without abnormal findings: Secondary | ICD-10-CM | POA: Diagnosis not present

## 2016-05-17 DIAGNOSIS — I259 Chronic ischemic heart disease, unspecified: Secondary | ICD-10-CM | POA: Diagnosis not present

## 2016-05-17 DIAGNOSIS — G5601 Carpal tunnel syndrome, right upper limb: Secondary | ICD-10-CM | POA: Diagnosis not present

## 2016-05-17 DIAGNOSIS — I10 Essential (primary) hypertension: Secondary | ICD-10-CM | POA: Diagnosis not present

## 2016-05-21 DIAGNOSIS — Z23 Encounter for immunization: Secondary | ICD-10-CM | POA: Diagnosis not present

## 2016-06-01 DIAGNOSIS — G5603 Carpal tunnel syndrome, bilateral upper limbs: Secondary | ICD-10-CM | POA: Diagnosis not present

## 2016-07-06 DIAGNOSIS — G5603 Carpal tunnel syndrome, bilateral upper limbs: Secondary | ICD-10-CM | POA: Diagnosis not present

## 2016-07-12 ENCOUNTER — Ambulatory Visit: Payer: Medicare Other | Admitting: Cardiovascular Disease

## 2016-07-13 DIAGNOSIS — G5603 Carpal tunnel syndrome, bilateral upper limbs: Secondary | ICD-10-CM | POA: Diagnosis not present

## 2016-07-19 ENCOUNTER — Telehealth: Payer: Self-pay | Admitting: Cardiovascular Disease

## 2016-07-19 NOTE — Telephone Encounter (Signed)
Request to hold plavix for 3 days prior to carpal tunnel release received from Dr. Hessie Knows, [facility].  Pt last OV November 2017 to f/u 6 months. Scheduled to see Dr. Fletcher Anon in July. Will call [facility] for surgery date and attempt to move up OV with Dr. Fletcher Anon.

## 2016-07-20 NOTE — Telephone Encounter (Signed)
Pt agreeable to move appt to June 7, 2:30pm with Christell Faith, PA-C for clearance for carpal tunnel release. He will have US carotids same day at 11:30am. Pt past due for 6 month f/u BP elevated at November 2017 OV. Atenolol d/c'd, coreg added. No recent BP readings in the system. Clearance given to Lars Pinks, RN.

## 2016-07-28 ENCOUNTER — Ambulatory Visit (INDEPENDENT_AMBULATORY_CARE_PROVIDER_SITE_OTHER): Payer: Medicare Other | Admitting: Physician Assistant

## 2016-07-28 ENCOUNTER — Encounter: Payer: Self-pay | Admitting: Physician Assistant

## 2016-07-28 ENCOUNTER — Ambulatory Visit: Payer: Medicare Other

## 2016-07-28 VITALS — BP 134/60 | HR 67 | Ht 66.0 in | Wt 182.5 lb

## 2016-07-28 DIAGNOSIS — I6522 Occlusion and stenosis of left carotid artery: Secondary | ICD-10-CM | POA: Diagnosis not present

## 2016-07-28 DIAGNOSIS — E782 Mixed hyperlipidemia: Secondary | ICD-10-CM | POA: Diagnosis not present

## 2016-07-28 DIAGNOSIS — I2510 Atherosclerotic heart disease of native coronary artery without angina pectoris: Secondary | ICD-10-CM

## 2016-07-28 DIAGNOSIS — I6523 Occlusion and stenosis of bilateral carotid arteries: Secondary | ICD-10-CM | POA: Diagnosis not present

## 2016-07-28 DIAGNOSIS — Z01810 Encounter for preprocedural cardiovascular examination: Secondary | ICD-10-CM | POA: Diagnosis not present

## 2016-07-28 DIAGNOSIS — I10 Essential (primary) hypertension: Secondary | ICD-10-CM | POA: Diagnosis not present

## 2016-07-28 DIAGNOSIS — R0602 Shortness of breath: Secondary | ICD-10-CM | POA: Diagnosis not present

## 2016-07-28 LAB — VAS US CAROTID
LCCADDIAS: -19 cm/s
LEFT ECA DIAS: -17 cm/s
LEFT VERTEBRAL DIAS: 15 cm/s
LICADDIAS: -20 cm/s
LICAPDIAS: 61 cm/s
LICAPSYS: 307 cm/s
Left CCA dist sys: -101 cm/s
Left CCA prox dias: 22 cm/s
Left CCA prox sys: 110 cm/s
Left ICA dist sys: -108 cm/s
RIGHT ECA DIAS: 19 cm/s
RIGHT VERTEBRAL DIAS: 13 cm/s
Right CCA prox dias: -21 cm/s
Right CCA prox sys: -116 cm/s
Right cca dist sys: 57 cm/s

## 2016-07-28 NOTE — Progress Notes (Signed)
Cardiology Office Note Date:  07/28/2016  Patient ID:  Anthony Johns, Anthony Johns January 09, 1928, MRN [id number] PCP:  Leonel Ramsay, MD  Cardiologist:  Dr. Fletcher Anon, MD    Chief Complaint: Follow up CAD and pre-operative cardiac evaluation   History of Present Illness: Anthony Johns is a 83 y.o. male with history of CAD with prior MI at age 61, stable class II angina, left carotid artery disease, HTN, and HLD who presents for follow up of his CAD.   He is s/p multiple PCI's with most recent cardiac cath from 03/2008 that showed mild LAD disease, occluded mid LCx with good collaterals, and 85% stenosed distal RCA. EF was mildly reduced. He underwent successful PCI/stenting to the distal RCA with placement of a 3.0 x 18 mm BMS. He was most recently seen by Dr. Fletcher Anon in 12/2015 for routine follow up and was doing well at that time. He has noted his chest pain is more noticable in the colder weather.   Most recent carotid doppler from 05/2015 showed stable 77-41% LICA stenosis and stable 1-39% RICA stenosis with recommended recheck in 05/2016. Carotid ultrasound performed this morning with results pending.   Most recent LDL from 04/2016 showed an LDL of 78, on simvastatin.   He has been seen by Dr. Rudene Christians, MD for evaluation of carpal tunnel syndrome with recommendation of release of the right. Orthopedics has requested cardiac evaluation prior to procedure.   Patient denies current chest pain. Though states he has noticed SOB while walking in from the parking lot for his appointment today. He reports worsening DOE in the last year. He attributes this DOE to a marked decline in activity and subsequent weight gain. He also reports less time outdoors due to poor heat tolerance. He has noted he has been unable to perform yardwork this spring/summer when compared to the year prior due to the above shortness of breath and exercise intolerance. He states that he periodically gets sharp chest pains and jaw pain but  usually "can feel it coming on" if he is overexerting himself and will rest before he has symptoms, though these have been going on for some time. He states he keeps a bottle of nitroglycerin with him though has not taken any in 2 years and even washed his pill bottle (declines refill at this time). He also notes periodic dizzy spells, usually after he bends his head up or holds his head at "a weird angle." The patient also c/o pain in his legs upon waking, left leg size larger than right, varicose veins on medial aspect of his left calf, and b/l edema of his legs.   Past Medical History:  Diagnosis Date  . Coronary artery disease    He reports having myocardial infarction at the age of 86. He reports having multiple PCI's most recently about 10 years ago. These records are not available.  . Essential hypertension   . Hiatal hernia   . Hypercalcemia   . Hyperlipidemia   . Hypertension   . Ischemic heart disease   . MI (myocardial infarction)     Past Surgical History:  Procedure Laterality Date  . CARDIAC CATHETERIZATION     [facility]  . CORONARY ANGIOPLASTY WITH STENT PLACEMENT      Current Outpatient Prescriptions  Medication Sig Dispense Refill  . amLODipine (NORVASC) 10 MG tablet Take 10 mg by mouth daily.     Marland Kitchen aspirin EC 81 MG tablet Take 81 mg by mouth daily.     Marland Kitchen  carvedilol (COREG) 6.25 MG tablet Take 1 tablet (6.25 mg total) by mouth 2 (two) times daily. 180 tablet 2  . clopidogrel (PLAVIX) 75 MG tablet Take 75 mg by mouth daily.     Marland Kitchen HYDROcodone-acetaminophen (NORCO) 5-325 MG tablet Take 1 tablet by mouth every 6 (six) hours as needed for severe pain. 12 tablet 0  . losartan (COZAAR) 100 MG tablet Take 100 mg by mouth daily.     . magnesium oxide (MAG-OX) 400 MG tablet Take 400 mg by mouth daily.     . nitroGLYCERIN (NITROSTAT) 0.4 MG SL tablet Place 0.4 mg under the tongue every 5 (five) minutes as needed.     Marland Kitchen omeprazole (PRILOSEC) 20 MG capsule TAKE 1 CAPSULE DAILY    .  simvastatin (ZOCOR) 20 MG tablet Take 20 mg by mouth daily at 6 PM.      No current facility-administered medications for this visit.     Allergies:   Patient has no known allergies.   Social History:  The patient  reports that he quit smoking about 34 years ago. His smoking use included Cigarettes. He has never used smokeless tobacco. He reports that he drinks alcohol. He reports that he does not use drugs.   Family History:  The patient's family history includes Hypertension in his father.  ROS:   Review of Systems  Constitutional: Positive for malaise/fatigue. Negative for chills, diaphoresis, fever and weight loss.  HENT: Negative.  Negative for ear discharge, hearing loss and nosebleeds.   Eyes: Negative.  Negative for photophobia.  Respiratory: Positive for shortness of breath. Negative for cough and stridor.   Cardiovascular: Positive for leg swelling. Negative for chest pain and claudication.  Gastrointestinal: Positive for heartburn. Negative for nausea and vomiting.       Patient reports IBS improving   Genitourinary: Negative.   Musculoskeletal: Positive for back pain.  Skin: Negative.   Neurological: Positive for dizziness and weakness. Negative for sensory change, speech change and focal weakness.  Endo/Heme/Allergies: Bruises/bleeds easily.  Psychiatric/Behavioral: Positive for memory loss. The patient is not nervous/anxious.   All other systems reviewed and are negative.    PHYSICAL EXAM:  VS:  BP 134/60 (BP Location: Left Arm, Patient Position: Sitting, Cuff Size: Normal)   Pulse 67   Ht 5\' 6"  (1.676 m)   Wt 182 lb 8 oz (82.8 kg)   BMI 29.46 kg/m  BMI: Body mass index is 29.46 kg/m.  Physical Exam  Constitutional: He is oriented to person, place, and time. He appears well-developed and well-nourished.  HENT:  Head: Normocephalic and atraumatic.  Eyes: Conjunctivae, EOM and lids are normal. Pupils are equal, round, and reactive to light.  Neck: Trachea  normal, normal range of motion, full passive range of motion without pain and phonation normal. Neck supple. No JVD present. Carotid bruit is present.  Cardiovascular: Normal rate, regular rhythm, intact distal pulses and normal pulses.   Murmur heard.  Harsh midsystolic murmur is present with a grade of 2/6  at the upper right sternal border radiating to the neck Pulses:      Carotid pulses are 2+ on the left side with bruit.      Posterior tibial pulses are 2+ on the right side, and 2+ on the left side.  Pulmonary/Chest: Effort normal and breath sounds normal. No accessory muscle usage. No respiratory distress.  Abdominal: Soft. Normal appearance, normal aorta and bowel sounds are normal. He exhibits no distension and no abdominal bruit. There is no  tenderness.  Musculoskeletal: Normal range of motion.  Neurological: He is alert and oriented to person, place, and time.  Skin: Skin is warm, dry and intact. No abrasion, no bruising, no burn, no ecchymosis, no laceration, no lesion, no petechiae and no rash noted. Rash is not urticarial. He is not diaphoretic. No cyanosis or erythema. No pallor. Nails show no clubbing.  Psychiatric: He has a normal mood and affect. His behavior is normal. Judgment and thought content normal. Cognition and memory are normal.  Patient reports recent memory issues not observed during HPI      EKG:  Was ordered and interpreted by me today. Shows NSR, 67 bpm, nonspecific inferolateral st/t changes  Recent Labs: No results found for requested labs within last 8760 hours.  No results found for requested labs within last 8760 hours.   CrCl cannot be calculated (No order found.).   Wt Readings from Last 3 Encounters:  01/12/16 179 lb 8 oz (81.4 kg)  07/24/15 174 lb (78.9 kg)  07/16/15 174 lb 12 oz (79.3 kg)     Other studies reviewed: Additional studies/records reviewed today include: summarized above  ASSESSMENT AND PLAN:  1. Pre-operative cardiac  evaluation:  PREOPERATIVE CARDIAC RISK ASSESSMENT:   Revised Cardiac Risk Index:  High Risk Surgery (defined as Intraperitoneal, intrathoracic or suprainguinal vascular): no; (carpal tunnel release)  Active CAD: yes; (prior MI)  CHF: yes  Cerebrovascular Disease: no   Diabetes: no; On Insulin: no  CKD (Cr >~ 2): no   Total: 2 Estimated Risk of Adverse Outcome: moderate risk for low risk surgery. Estimated Risk of MI, PE, VF/VT (Cardiac Arrest), Complete Heart Block: 6.6%   ACC/AHA Guidelines for "Clearance":  Step 1 - Need for Emergency Surgery: no  If Yes - go straight to OR with perioperative surveillance  Step 2 - Active Cardiac Conditions (Unstable Angina, Decompensated HF, Significant  Arrhytmias - Complete HB, Mobitz II, Symptomatic VT or SVT, Severe Aortic Stenosis - mean gradient > 40 mmHg, Valve area < 1.0 cm2): no  If Yes - Evaluate & Treat per ACC/AHA Guidelines  Step 3 -  Low Risk Surgery: yes  If Yes --> proceed to OR  If No --> Step 4  Step 4 - Functional Capacity >= 4 METS without symptoms: Duke Activity Index: 18.95  If Yes --> proceed to OR  If No --> Step 5  Step 5 --  Clinical Risk Factors (CRF)  - Zero --> proceed to OR  Given patient's functional status declined over the past 12 months we will schedule patient for Lexiscan Myoview to evaluate for high-risk ischemia prior to his planned carpal tunnel release as well as to further risk stratify. Given murmur heard on exam recommend echocardiogram as well though this would not need to be completed prior to his planned procedure.  2. CAD of native coronary arteries with stable angina: Patient does have stable angina however over the past 12 months he has declined as above. We'll schedule nuclear stress testing as above to evaluate for high-risk ischemia. Further recommendations pending. Continue aspirin. Patient will need to hold Plavix 5 days prior to his surgery date (surgical date is not set at  this time). Did not advise patient to hold Plavix at this time yet as would prefer to see the above nuclear stress test result.  3. Left carotid stenosis: Patient underwent carotid artery ultrasound this morning with technician reported stable carotid artery disease. Await formal read.  4. HTN: Reasonably well-controlled. Continue current medications.  5. HLD: Continue simvastatin.  Disposition: F/u with Dr. Fletcher Anon, MD in 4 weeks.  Current medicines are reviewed at length with the patient today.  The patient did not have any concerns regarding medicines.  Melvern Banker PA-C 07/28/2016 8:10 AM     [address] [place], [place] [address] [phone]

## 2016-07-28 NOTE — Patient Instructions (Addendum)
Medication Instructions:  Your physician recommends that you continue on your current medications as directed. Please refer to the Current Medication list given to you today.   Labwork: none  Testing/Procedures: Your physician has requested that you have a lexiscan myoview. For further information please visit [email or web address]. Please follow instruction sheet, as given.  [place]  Your caregiver has ordered a Stress Test with nuclear imaging. The purpose of this test is to evaluate the blood supply to your heart muscle. This procedure is referred to as a "Non-Invasive Stress Test." This is because other than having an IV started in your vein, nothing is inserted or "invades" your body. Cardiac stress tests are done to find areas of poor blood flow to the heart by determining the extent of coronary artery disease (CAD). Some patients exercise on a treadmill, which naturally increases the blood flow to your heart, while others who are  unable to walk on a treadmill due to physical limitations have a pharmacologic/chemical stress agent called Lexiscan . This medicine will mimic walking on a treadmill by temporarily increasing your coronary blood flow.   Please note: these test may take anywhere between 2-4 hours to complete  PLEASE REPORT TO [facility] AT THE FIRST DESK WILL DIRECT YOU WHERE TO GO  Date of Procedure: Monday, June 11  Arrival Time for Procedure: 9:15am  Instructions regarding medication:     __x__:  Hold coreg night before procedure and morning of procedure  PLEASE NOTIFY THE OFFICE AT LEAST 24 HOURS IN ADVANCE IF YOU ARE UNABLE TO KEEP YOUR APPOINTMENT.  [phone] AND  PLEASE NOTIFY NUCLEAR MEDICINE AT [facility] AT LEAST 24 HOURS IN ADVANCE IF YOU ARE UNABLE TO KEEP YOUR APPOINTMENT. [phone]  How to prepare for your Myoview test:  1. Do not eat or drink after midnight 2. No caffeine for 24 hours prior to test 3. No smoking  24 hours prior to test. 4. Your medication may be taken with water.  If your doctor stopped a medication because of this test, do not take that medication. 5. Ladies, please do not wear dresses.  Skirts or pants are appropriate. Please wear a short sleeve shirt. 6. No perfume, cologne or lotion. 7. Wear comfortable walking shoes. No heels!            Follow-Up: Your physician recommends that you schedule a follow-up appointment in: 3 months with Dr. Fletcher Anon.    Any Other Special Instructions Will Be Listed Below (If Applicable).     If you need a refill on your cardiac medications before your next appointment, please call your pharmacy.  Cardiac Nuclear Scan A cardiac nuclear scan is a test that measures blood flow to the heart when a person is resting and when he or she is exercising. The test looks for problems such as:  Not enough blood reaching a portion of the heart.  The heart muscle not working normally.  You may need this test if:  You have heart disease.  You have had abnormal lab results.  You have had heart surgery or angioplasty.  You have chest pain.  You have shortness of breath.  In this test, a radioactive dye (tracer) is injected into your bloodstream. After the tracer has traveled to your heart, an imaging device is used to measure how much of the tracer is absorbed by or distributed to various areas of your heart. This procedure is usually done at a hospital and takes 2-4 hours.  Tell a health care provider about:  Any allergies you have.  All medicines you are taking, including vitamins, herbs, eye drops, creams, and over-the-counter medicines.  Any problems you or family members have had with the use of anesthetic medicines.  Any blood disorders you have.  Any surgeries you have had.  Any medical conditions you have.  Whether you are pregnant or may be pregnant. What are the risks? Generally, this is a safe procedure. However, problems may  occur, including:  Serious chest pain and heart attack. This is only a risk if the stress portion of the test is done.  Rapid heartbeat.  Sensation of warmth in your chest. This usually passes quickly.  What happens before the procedure?  Ask your health care provider about changing or stopping your regular medicines. This is especially important if you are taking diabetes medicines or blood thinners.  Remove your jewelry on the day of the procedure. What happens during the procedure?  An IV tube will be inserted into one of your veins.  Your health care provider will inject a small amount of radioactive tracer through the tube.  You will wait for 20-40 minutes while the tracer travels through your bloodstream.  Your heart activity will be monitored with an electrocardiogram (ECG).  You will lie down on an exam table.  Images of your heart will be taken for about 15-20 minutes.  You may be asked to exercise on a treadmill or stationary bike. While you exercise, your heart's activity will be monitored with an ECG, and your blood pressure will be checked. If you are unable to exercise, you may be given a medicine to increase blood flow to parts of your heart.  When blood flow to your heart has peaked, a tracer will again be injected through the IV tube.  After 20-40 minutes, you will get back on the exam table and have more images taken of your heart.  When the procedure is over, your IV tube will be removed. The procedure may vary among health care providers and hospitals. Depending on the type of tracer used, scans may need to be repeated 3-4 hours later. What happens after the procedure?  Unless your health care provider tells you otherwise, you may return to your normal schedule, including diet, activities, and medicines.  Unless your health care provider tells you otherwise, you may increase your fluid intake. This will help flush the contrast dye from your body. Drink  enough fluid to keep your urine clear or pale yellow.  It is up to you to get your test results. Ask your health care provider, or the department that is doing the test, when your results will be ready. Summary  A cardiac nuclear scan measures the blood flow to the heart when a person is resting and when he or she is exercising.  You may need this test if you are at risk for heart disease.  Tell your health care provider if you are pregnant.  Unless your health care provider tells you otherwise, increase your fluid intake. This will help flush the contrast dye from your body. Drink enough fluid to keep your urine clear or pale yellow. This information is not intended to replace advice given to you by your health care provider. Make sure you discuss any questions you have with your health care provider. Document Released: 03/04/2004 Document Revised: 02/10/2016 Document Reviewed: 01/16/2013 Elsevier Interactive Patient Education  2017 [employer].

## 2016-08-01 ENCOUNTER — Other Ambulatory Visit: Payer: Self-pay

## 2016-08-01 ENCOUNTER — Ambulatory Visit
Admission: RE | Admit: 2016-08-01 | Discharge: 2016-08-01 | Disposition: A | Discharge status: Home / Self Care | Payer: Medicare Other | Source: Ambulatory Visit | Attending: Physician Assistant | Admitting: Physician Assistant

## 2016-08-01 DIAGNOSIS — Z01818 Encounter for other preprocedural examination: Secondary | ICD-10-CM | POA: Diagnosis not present

## 2016-08-01 DIAGNOSIS — Z01810 Encounter for preprocedural cardiovascular examination: Secondary | ICD-10-CM | POA: Diagnosis not present

## 2016-08-01 DIAGNOSIS — I2510 Atherosclerotic heart disease of native coronary artery without angina pectoris: Secondary | ICD-10-CM | POA: Diagnosis not present

## 2016-08-01 DIAGNOSIS — R0602 Shortness of breath: Secondary | ICD-10-CM

## 2016-08-01 DIAGNOSIS — I6523 Occlusion and stenosis of bilateral carotid arteries: Secondary | ICD-10-CM

## 2016-08-01 LAB — NM MYOCAR MULTI W/SPECT W/WALL MOTION / EF
CSEPEDS: 0 s
Estimated workload: 1 METS
Exercise duration (min): 0 min
LVDIAVOL: 84 mL (ref 62–150)
LVSYSVOL: 26 mL
MPHR: 133 {beats}/min
Peak HR: 86 {beats}/min
Percent HR: 64 %
Rest HR: 72 {beats}/min
SDS: 3
SRS: 13
SSS: 6
TID: 1.04

## 2016-08-01 MED ORDER — TECHNETIUM TC 99M TETROFOSMIN IV KIT
30.0000 | PACK | Freq: Once | INTRAVENOUS | Status: AC | PRN
  Administered 2016-08-01: 28 via INTRAVENOUS

## 2016-08-01 MED ORDER — REGADENOSON 0.4 MG/5ML IV SOLN
0.4000 mg | Freq: Once | INTRAVENOUS | Status: AC
  Administered 2016-08-01: 0.4 mg via INTRAVENOUS

## 2016-08-01 MED ORDER — TECHNETIUM TC 99M TETROFOSMIN IV KIT
13.0000 | PACK | Freq: Once | INTRAVENOUS | Status: AC | PRN
  Administered 2016-08-01: 13.42 via INTRAVENOUS

## 2016-08-02 ENCOUNTER — Telehealth: Payer: Self-pay | Admitting: Physician Assistant

## 2016-08-02 NOTE — Telephone Encounter (Signed)
Myriam Jacobson, Surgery Coordinator with [facility] inquires if pt can hold plavix 3 days prior to carpel tunnel procedure. Per Thurmond Butts Dunn's notes: "CAD of native coronary arteries with stable angina: Patient does have stable angina however over the past 12 months he has declined as above. We'll schedule nuclear stress testing as above to evaluate for high-risk ischemia. Further recommendations pending. Continue aspirin. Patient will need to hold Plavix 5 days prior to his surgery date (surgical date is not set at this time). Did not advise patient to hold Plavix at this time yet as would prefer to see the above nuclear stress test result."  Routed to Christell Faith, PA for recommendation.

## 2016-08-03 NOTE — Telephone Encounter (Signed)
Clearance routed to [place] at [phone] via [facility] fax.

## 2016-08-03 NOTE — Telephone Encounter (Signed)
Yes, ok to hold Plavix x 5 days prior to surgical procedure. Continue ASA 81 mg daily throughout procedure. Restart Plavix when able per surgical team.

## 2016-08-22 ENCOUNTER — Ambulatory Visit: Payer: Medicare Other | Admitting: Cardiovascular Disease

## 2016-08-26 DIAGNOSIS — G5603 Carpal tunnel syndrome, bilateral upper limbs: Secondary | ICD-10-CM | POA: Diagnosis not present

## 2016-09-15 ENCOUNTER — Other Ambulatory Visit: Payer: Self-pay | Admitting: Cardiovascular Disease

## 2016-10-31 ENCOUNTER — Ambulatory Visit (INDEPENDENT_AMBULATORY_CARE_PROVIDER_SITE_OTHER): Payer: Medicare Other | Admitting: Cardiovascular Disease

## 2016-10-31 VITALS — BP 134/60 | HR 68 | Ht 66.0 in | Wt 182.5 lb

## 2016-10-31 DIAGNOSIS — I10 Essential (primary) hypertension: Secondary | ICD-10-CM | POA: Diagnosis not present

## 2016-10-31 DIAGNOSIS — I6523 Occlusion and stenosis of bilateral carotid arteries: Secondary | ICD-10-CM | POA: Diagnosis not present

## 2016-10-31 DIAGNOSIS — I739 Peripheral vascular disease, unspecified: Secondary | ICD-10-CM

## 2016-10-31 DIAGNOSIS — E7800 Pure hypercholesterolemia, unspecified: Secondary | ICD-10-CM

## 2016-10-31 DIAGNOSIS — I779 Disorder of arteries and arterioles, unspecified: Secondary | ICD-10-CM

## 2016-10-31 DIAGNOSIS — I2510 Atherosclerotic heart disease of native coronary artery without angina pectoris: Secondary | ICD-10-CM

## 2016-10-31 NOTE — Patient Instructions (Signed)
Medication Instructions: Continue same medications.   Labwork: None.   Procedures/Testing: None.   Follow-Up: 6 months with Dr. Arida.   Any Additional Special Instructions Will Be Listed Below (If Applicable).     If you need a refill on your cardiac medications before your next appointment, please call your pharmacy.   

## 2016-10-31 NOTE — Progress Notes (Signed)
Cardiology Office Note   Date:  10/31/2016   ID:  Anthony Johns, DOB October 06, 1927, MRN [id number]  PCP:  Leonel Ramsay, MD  Cardiologist:   Kathlyn Sacramento, MD   Chief Complaint  Patient presents with  . OTHER    3 month f/u. Meds reviewed verbally with pt.      History of Present Illness: Anthony Johns is a 83 y.o. male who presents for a follow-up visit regarding coronary artery disease.  He has extensive history of ischemic heart disease with previous myocardial infarction at the age of 37. He had multiple PCI's done. Most recent cardiac cath that I have on record is from February 2010 which showed mild LAD disease, occluded mid left circumflex with good collaterals, and 85% stenosis in the distal RCA. Ejection fraction was mildly reduced. He underwent successful angioplasty on the distal right coronary artery with placement of a 3.0 x 18 mm vision bare-metal stent.  He was seen by Thurmond Butts recently for preoperative cardiovascular evaluation before carpal tunnel surgery. He underwent a pharmacologic nuclear stress test which showed a small infarct in the left circumflex distribution without ischemia with normal ejection fraction. Overall low risk study.  He has been doing reasonably well overall with stable symptoms. He continues to have jaw pain with over exertion and has stable exertional dyspnea.   Past Medical History:  Diagnosis Date  . Coronary artery disease    He reports having myocardial infarction at the age of 74. He reports having multiple PCI's most recently about 10 years ago. These records are not available.  . Essential hypertension   . Hiatal hernia   . Hypercalcemia   . Hyperlipidemia   . Hypertension   . Ischemic heart disease   . MI (myocardial infarction) [facility]     Past Surgical History:  Procedure Laterality Date  . CARDIAC CATHETERIZATION     [facility]  . CORONARY ANGIOPLASTY WITH STENT PLACEMENT       Current Outpatient Prescriptions    Medication Sig Dispense Refill  . amLODipine (NORVASC) 10 MG tablet Take 10 mg by mouth daily.     Marland Kitchen aspirin EC 81 MG tablet Take 81 mg by mouth daily.     . carvedilol (COREG) 6.25 MG tablet TAKE 1 TABLET TWICE A DAY 180 tablet 2  . clopidogrel (PLAVIX) 75 MG tablet Take 75 mg by mouth daily.     Marland Kitchen losartan (COZAAR) 100 MG tablet Take 100 mg by mouth daily.     . magnesium oxide (MAG-OX) 400 MG tablet Take 400 mg by mouth daily.     . nitroGLYCERIN (NITROSTAT) 0.4 MG SL tablet Place 0.4 mg under the tongue every 5 (five) minutes as needed.     Marland Kitchen omeprazole (PRILOSEC) 20 MG capsule TAKE 1 CAPSULE DAILY    . simvastatin (ZOCOR) 20 MG tablet Take 20 mg by mouth daily at 6 PM.      No current facility-administered medications for this visit.     Allergies:   Tape    Social History:  The patient  reports that he quit smoking about 34 years ago. His smoking use included Cigarettes. He has never used smokeless tobacco. He reports that he drinks alcohol. He reports that he does not use drugs.   Family History:  The patient's family history includes Hypertension in his father.    ROS:  Please see the history of present illness.   Otherwise, review of systems are positive for none.  All other systems are reviewed and negative.    PHYSICAL EXAM: VS:  BP 134/60 (BP Location: Left Arm, Patient Position: Sitting, Cuff Size: Normal)   Pulse 68   Ht 5\' 6"  (1.676 m)   Wt 182 lb 8 oz (82.8 kg)   SpO2 94%   BMI 29.46 kg/m  , BMI Body mass index is 29.46 kg/m. GEN: Well nourished, well developed, in no acute distress  HEENT: normal  Neck: no JVD, or masses . Right carotid bruit. Cardiac: RRR; no  rubs, or gallops,no edema . 1 / 6 systolic murmur at the left sternal border Respiratory:  clear to auscultation bilaterally, normal work of breathing GI: soft, nontender, nondistended, + BS MS: no deformity or atrophy  Skin: warm and dry, no rash Neuro:  Strength and sensation are intact Psych:  euthymic mood, full affect   EKG:  EKG is not ordered today.    Recent Labs: No results found for requested labs within last 8760 hours.    Lipid Panel No results found for: CHOL, TRIG, HDL, CHOLHDL, VLDL, LDLCALC, LDLDIRECT    Wt Readings from Last 3 Encounters:  10/31/16 182 lb 8 oz (82.8 kg)  07/28/16 182 lb 8 oz (82.8 kg)  01/12/16 179 lb 8 oz (81.4 kg)         ASSESSMENT AND PLAN:  1.  Coronary artery disease involving native coronary arteries with stable angina:  He has stable class II angina. Continue current medical therapy. Recent stress test showed evidence of prior infarct in the left circumflex distribution without ischemia. This is expected based on his most recent cardiac catheterization.  2. Left carotid stenosis: Moderate. Repeat study In June of 2019.  3. Essential hypertension:  Blood pressure improved after switching atenolol to carvedilol.  4. Hyperlipidemia: Continue treatment with simvastatin. Most recent lipid profile in March showed an LDL of 72.   Disposition:   FU with me in 6 months  Signed,  Kathlyn Sacramento, MD  10/31/2016 2:25 PM    [place]

## 2016-11-18 DIAGNOSIS — D519 Vitamin B12 deficiency anemia, unspecified: Secondary | ICD-10-CM | POA: Diagnosis not present

## 2016-11-18 DIAGNOSIS — G6289 Other specified polyneuropathies: Secondary | ICD-10-CM | POA: Diagnosis not present

## 2016-11-18 DIAGNOSIS — K582 Mixed irritable bowel syndrome: Secondary | ICD-10-CM | POA: Diagnosis not present

## 2016-11-18 DIAGNOSIS — G5692 Unspecified mononeuropathy of left upper limb: Secondary | ICD-10-CM | POA: Diagnosis not present

## 2016-11-18 DIAGNOSIS — I10 Essential (primary) hypertension: Secondary | ICD-10-CM | POA: Diagnosis not present

## 2016-11-18 DIAGNOSIS — E785 Hyperlipidemia, unspecified: Secondary | ICD-10-CM | POA: Diagnosis not present

## 2016-11-18 DIAGNOSIS — G5603 Carpal tunnel syndrome, bilateral upper limbs: Secondary | ICD-10-CM | POA: Diagnosis not present

## 2017-01-26 DIAGNOSIS — R600 Localized edema: Secondary | ICD-10-CM | POA: Diagnosis not present

## 2017-03-21 DIAGNOSIS — R05 Cough: Secondary | ICD-10-CM | POA: Diagnosis not present

## 2017-03-21 DIAGNOSIS — J189 Pneumonia, unspecified organism: Secondary | ICD-10-CM | POA: Diagnosis not present

## 2017-04-04 DIAGNOSIS — E785 Hyperlipidemia, unspecified: Secondary | ICD-10-CM | POA: Diagnosis not present

## 2017-04-04 DIAGNOSIS — J189 Pneumonia, unspecified organism: Secondary | ICD-10-CM | POA: Diagnosis not present

## 2017-04-04 DIAGNOSIS — I10 Essential (primary) hypertension: Secondary | ICD-10-CM | POA: Diagnosis not present

## 2017-04-04 DIAGNOSIS — I214 Non-ST elevation (NSTEMI) myocardial infarction: Secondary | ICD-10-CM | POA: Diagnosis not present

## 2017-05-03 DIAGNOSIS — R9389 Abnormal findings on diagnostic imaging of other specified body structures: Secondary | ICD-10-CM | POA: Diagnosis not present

## 2017-05-03 DIAGNOSIS — J189 Pneumonia, unspecified organism: Secondary | ICD-10-CM | POA: Diagnosis not present

## 2017-05-10 ENCOUNTER — Other Ambulatory Visit: Payer: Self-pay | Admitting: Infectious Diseases

## 2017-05-10 DIAGNOSIS — R9389 Abnormal findings on diagnostic imaging of other specified body structures: Secondary | ICD-10-CM

## 2017-05-16 DIAGNOSIS — I214 Non-ST elevation (NSTEMI) myocardial infarction: Secondary | ICD-10-CM | POA: Diagnosis not present

## 2017-05-16 DIAGNOSIS — E785 Hyperlipidemia, unspecified: Secondary | ICD-10-CM | POA: Diagnosis not present

## 2017-05-16 DIAGNOSIS — I10 Essential (primary) hypertension: Secondary | ICD-10-CM | POA: Diagnosis not present

## 2017-05-23 DIAGNOSIS — I10 Essential (primary) hypertension: Secondary | ICD-10-CM | POA: Diagnosis not present

## 2017-05-23 DIAGNOSIS — E785 Hyperlipidemia, unspecified: Secondary | ICD-10-CM | POA: Diagnosis not present

## 2017-05-23 DIAGNOSIS — R9389 Abnormal findings on diagnostic imaging of other specified body structures: Secondary | ICD-10-CM | POA: Diagnosis not present

## 2017-05-23 DIAGNOSIS — Z0000 Encounter for general adult medical examination without abnormal findings: Secondary | ICD-10-CM | POA: Diagnosis not present

## 2017-05-23 DIAGNOSIS — I259 Chronic ischemic heart disease, unspecified: Secondary | ICD-10-CM | POA: Diagnosis not present

## 2017-05-25 ENCOUNTER — Ambulatory Visit
Admission: RE | Admit: 2017-05-25 | Discharge: 2017-05-25 | Disposition: A | Discharge status: Home / Self Care | Payer: Medicare Other | Source: Ambulatory Visit | Attending: Infectious Diseases | Admitting: Infectious Diseases

## 2017-05-25 DIAGNOSIS — R9389 Abnormal findings on diagnostic imaging of other specified body structures: Secondary | ICD-10-CM

## 2017-05-25 DIAGNOSIS — I700 Atherosclerosis of aorta: Secondary | ICD-10-CM | POA: Insufficient documentation

## 2017-05-25 DIAGNOSIS — K449 Diaphragmatic hernia without obstruction or gangrene: Secondary | ICD-10-CM | POA: Diagnosis not present

## 2017-05-25 DIAGNOSIS — I2510 Atherosclerotic heart disease of native coronary artery without angina pectoris: Secondary | ICD-10-CM | POA: Diagnosis not present

## 2017-05-25 DIAGNOSIS — J432 Centrilobular emphysema: Secondary | ICD-10-CM | POA: Diagnosis not present

## 2017-05-25 DIAGNOSIS — R918 Other nonspecific abnormal finding of lung field: Secondary | ICD-10-CM | POA: Diagnosis not present

## 2017-05-28 ENCOUNTER — Other Ambulatory Visit: Payer: Self-pay | Admitting: Cardiovascular Disease

## 2017-05-30 ENCOUNTER — Other Ambulatory Visit: Payer: Self-pay | Admitting: Infectious Diseases

## 2017-05-30 DIAGNOSIS — R918 Other nonspecific abnormal finding of lung field: Secondary | ICD-10-CM

## 2017-06-28 ENCOUNTER — Encounter: Payer: Self-pay | Admitting: Nurse Practitioner

## 2017-06-28 ENCOUNTER — Ambulatory Visit (INDEPENDENT_AMBULATORY_CARE_PROVIDER_SITE_OTHER): Payer: Medicare Other | Admitting: Nurse Practitioner

## 2017-06-28 VITALS — BP 140/60 | Ht 66.0 in | Wt 183.2 lb

## 2017-06-28 DIAGNOSIS — I2510 Atherosclerotic heart disease of native coronary artery without angina pectoris: Secondary | ICD-10-CM | POA: Diagnosis not present

## 2017-06-28 DIAGNOSIS — I6523 Occlusion and stenosis of bilateral carotid arteries: Secondary | ICD-10-CM | POA: Diagnosis not present

## 2017-06-28 DIAGNOSIS — I5031 Acute diastolic (congestive) heart failure: Secondary | ICD-10-CM

## 2017-06-28 DIAGNOSIS — I10 Essential (primary) hypertension: Secondary | ICD-10-CM

## 2017-06-28 DIAGNOSIS — E782 Mixed hyperlipidemia: Secondary | ICD-10-CM

## 2017-06-28 MED ORDER — FUROSEMIDE 20 MG PO TABS: 20.0000 mg | ORAL_TABLET | Freq: Every day | ORAL | 3 refills | Status: DC

## 2017-06-28 NOTE — Progress Notes (Signed)
Office Visit    Patient Name: Anthony Johns Date of Encounter: 06/28/2017  Primary Care Provider:  Leonel Ramsay, MD Primary Cardiologist:  Kathlyn Sacramento, MD  Chief Complaint    84 y/o ? with a h/o CAD, HTN, and HL, who presents for f/u related to ongoing DOE and lower ext swelling.  Past Medical History    Past Medical History:  Diagnosis Date  . Coronary artery disease    a. He reports having myocardial infarction at the age of 15. He reports having multiple PCI's; b. 03/2008 Cath/PCI: LAD mild dzs, LCX 188m, RCA 85d (3.0x18 Vision BMS); c. 07/2016 MV: EF 55-65%, mid inf/inflat infarct w/o ischemia.  . Essential hypertension   . Hiatal hernia   . Hypercalcemia   . Hyperlipidemia   . Hypertension   . Ischemic heart disease   . MI (myocardial infarction) [facility])    Past Surgical History:  Procedure Laterality Date  . CARDIAC CATHETERIZATION     [facility]  . CORONARY ANGIOPLASTY WITH STENT PLACEMENT      Allergies  Allergies  Allergen Reactions  . Tape     History of Present Illness    84 year old male with the above past medical history including CAD status post prior MI and multiple PCI's including bare-metal stenting of the right coronary artery in 2010.  He has a known chronic total occlusion of the left circumflex.  Stress testing in June 2018 revealed mid inferior/inferolateral infarct without ischemia.  Other history includes hypertension and hyperlipidemia.  He was last seen in September and was doing well at that time.  In February, he began to experience nasal and chest congestion with cough.  He was eventually diagnosed with pneumonia and was treated successfully with antibiotics.  Follow-up chest x-ray showed persistent opacities and a CT scan was performed, which apparently showed pulmonary nodules.  Results not available.  He is scheduled for follow-up CT in June or July.  He says he was slow to recover from pneumonia but overall has improved.  He does  continue to note dyspnea on exertion however and has also noticed more pronounced bilateral lower extremity edema.  He denies chest pain, PND, orthopnea, dizziness, syncope, or early satiety.  Home Medications    Prior to Admission medications   Medication Sig Start Date End Date Taking? Authorizing Provider  amLODipine (NORVASC) 10 MG tablet Take 10 mg by mouth daily.  04/21/14  Yes [provider]  aspirin EC 81 MG tablet Take 81 mg by mouth daily.    Yes [provider]  carvedilol (COREG) 6.25 MG tablet TAKE 1 TABLET TWICE A DAY 05/29/17  Yes Wellington Hampshire, MD  clopidogrel (PLAVIX) 75 MG tablet Take 75 mg by mouth daily.  03/10/14  Yes [provider]  losartan (COZAAR) 100 MG tablet Take 100 mg by mouth daily.  04/21/14  Yes [provider]  magnesium oxide (MAG-OX) 400 MG tablet Take 400 mg by mouth daily.    Yes [provider]  nitroGLYCERIN (NITROSTAT) 0.4 MG SL tablet Place 0.4 mg under the tongue every 5 (five) minutes as needed.  04/21/14  Yes [provider]  omeprazole (PRILOSEC) 20 MG capsule TAKE 1 CAPSULE DAILY 11/13/13  Yes [provider]  sertraline (ZOLOFT) 25 MG tablet Take 25 mg by mouth daily. 02/15/17  Yes [provider]  simvastatin (ZOCOR) 20 MG tablet Take 20 mg by mouth daily at 6 PM.  04/21/14  Yes [provider]  furosemide (LASIX) 20 MG tablet Take 1 tablet (20 mg total) by mouth daily. 06/28/17 09/26/17  Theora Gianotti, NP    Review of Systems    Ongoing dyspnea on exertion and lower extremity swelling as outlined above.  He denies chest pain, palpitations, PND, orthopnea, dizziness, syncope, or early satiety.  All other systems reviewed and are otherwise negative except as noted above.  Physical Exam    VS:  BP 140/60 (BP Location: Left Arm, Patient Position: Sitting, Cuff Size: Normal)   Ht 5\' 6"  (1.676 m)   Wt 183 lb 4 oz (83.1 kg)   BMI 29.58 kg/m   , BMI Body mass  index is 29.58 kg/m. GEN: Well nourished, well developed, in no acute distress.  HEENT: normal.  Neck: Supple, JVP approximately 10 to 12 cm, no carotid bruits, or masses. Cardiac: RRR, 1/6 systolic murmur at the right upper sternal border, 2/6 systolic murmur at the left lower sternal border, no rubs, or gallops. No clubbing, cyanosis, 1+ bilateral lower extremity edema to the knees .  Radials/PT 1+ and equal bilaterally.  Respiratory:  Respirations regular and unlabored, clear to auscultation bilaterally. GI: Soft, nontender, nondistended, BS + x 4. MS: no deformity or atrophy. Skin: warm and dry, no rash. Neuro:  Strength and sensation are intact. Psych: Normal affect.  Accessory Clinical Findings    ECG -regular sinus rhythm, 62, left axis deviation, PAC, inferior infarct, no acute ST or T changes.  Assessment & Plan    1.  Acute presumably diastolic CHF: Patient with ongoing dyspnea after suffering pneumonia earlier this year.  He has also had increasing lower extremity edema.  He has mild volume excess on exam.  Weight is only up about 1 to 2 pounds since last visit however.  Blood pressure mildly elevated.  I am adding Lasix 20 mg once daily.  Plan to follow-up a basic metabolic panel in 1 week.  We will also arrange for a 2D echocardiogram to evaluate LV function and valves as he does have murmurs.  Plan to see him back in a few weeks.  2.  Essential hypertension: As above, blood pressure moderately elevated.  Adding low-dose Lasix today in the setting of volume overload.  3.  Coronary artery disease: He has some pleuritic chest pain and muscular skeletal pain in the setting of coughing and pneumonia earlier this year.  This is resolved.  Nonischemic Myoview last June.  Continue aspirin, beta-blocker, Plavix, and statin therapy.  4.  Hyperlipidemia: Remains on simvastatin with an LDL of 83.  5.  Pulmonary nodules: Pending follow-up CT through primary care.  6.  Carotid arterial  disease: Due for follow-up ultrasound in a few weeks.  I will arrange.    7.  Disposition: Follow-up basic metabolic panel 1 week.  Follow-up in clinic in approximately 2 weeks.   Murray Hodgkins, NP 06/28/2017, 12:56 PM

## 2017-06-28 NOTE — Patient Instructions (Signed)
Medication Instructions: - Your physician has recommended you make the following change in your medication:   1) START lasix (furosemide) 20 mg- take 1 tablet (20 mg) by mouth once daily  Labwork: - Your physician recommends that you return for lab work in: 1 week- BMP  Procedures/Testing: - Your physician has requested that you have an echocardiogram- in 1-2 weeks. Echocardiography is a painless test that uses sound waves to create images of your heart. It provides your doctor with information about the size and shape of your heart and how well your heart's chambers and valves are working. This procedure takes approximately one hour. There are no restrictions for this procedure.  - Your physician has requested that you have a carotid duplex- in 1-2 weeks (same day of echo if possible). This test is an ultrasound of the carotid arteries in your neck. It looks at blood flow through these arteries that supply the brain with blood. Allow one hour for this exam. There are no restrictions or special instructions.  Follow-Up: - Your physician recommends that you schedule a follow-up appointment in: 2-3 weeks with Ignacia Bayley, NP   Any Additional Special Instructions Will Be Listed Below (If Applicable).     If you need a refill on your cardiac medications before your next appointment, please call your pharmacy.

## 2017-07-05 ENCOUNTER — Other Ambulatory Visit (INDEPENDENT_AMBULATORY_CARE_PROVIDER_SITE_OTHER): Payer: Medicare Other

## 2017-07-05 DIAGNOSIS — I10 Essential (primary) hypertension: Secondary | ICD-10-CM | POA: Diagnosis not present

## 2017-07-05 DIAGNOSIS — I5031 Acute diastolic (congestive) heart failure: Secondary | ICD-10-CM | POA: Diagnosis not present

## 2017-07-06 LAB — BASIC METABOLIC PANEL
BUN/Creatinine Ratio: 14 (ref 10–24)
BUN: 17 mg/dL (ref 8–27)
CALCIUM: 9.9 mg/dL (ref 8.6–10.2)
CO2: 23 mmol/L (ref 20–29)
CREATININE: 1.18 mg/dL (ref 0.76–1.27)
Chloride: 103 mmol/L (ref 96–106)
GFR calc Af Amer: 63 mL/min/{1.73_m2} (ref >59)
GFR, EST NON AFRICAN AMERICAN: 55 mL/min/{1.73_m2} — AB (ref >59)
Glucose: 110 mg/dL — ABNORMAL HIGH (ref 65–99)
POTASSIUM: 5 mmol/L (ref 3.5–5.2)
Sodium: 142 mmol/L (ref 134–144)

## 2017-07-13 ENCOUNTER — Ambulatory Visit (INDEPENDENT_AMBULATORY_CARE_PROVIDER_SITE_OTHER): Payer: Medicare Other

## 2017-07-13 ENCOUNTER — Other Ambulatory Visit: Payer: Self-pay

## 2017-07-13 DIAGNOSIS — I5031 Acute diastolic (congestive) heart failure: Secondary | ICD-10-CM

## 2017-07-13 DIAGNOSIS — I6523 Occlusion and stenosis of bilateral carotid arteries: Secondary | ICD-10-CM

## 2017-07-14 ENCOUNTER — Encounter: Payer: Self-pay | Admitting: Nurse Practitioner

## 2017-07-19 ENCOUNTER — Ambulatory Visit: Payer: Medicare Other | Admitting: Nurse Practitioner

## 2017-07-20 ENCOUNTER — Other Ambulatory Visit: Payer: Self-pay | Admitting: *Deleted

## 2017-07-20 DIAGNOSIS — I779 Disorder of arteries and arterioles, unspecified: Secondary | ICD-10-CM

## 2017-07-20 DIAGNOSIS — I739 Peripheral vascular disease, unspecified: Principal | ICD-10-CM

## 2017-07-20 NOTE — Progress Notes (Signed)
Cardiology Office Note Date:  07/21/2017  Patient ID:  Anthony Johns, Anthony Johns 17-Jun-1927, MRN [id number] PCP:  Leonel Ramsay, MD  Cardiologist:  Dr. Fletcher Anon, MD    Chief Complaint: Follow-up  History of Present Illness: Anthony Johns is a 84 y.o. male with history of CAD status post prior MI with multiple PCI's including BMS of the RCA in 2010, known CTO of the LCx, HTN, HLD, and hiatal hernia who presents for follow-up of acute on chronic diastolic CHF.  Patient underwent stress testing in 07/2016 that revealed mid inferior/inferolateral infarct without ischemia.  He was most recently seen in the office on 06/28/2017 and noted that back and February, 2019, he began to experience nasal and chest congestion with associated cough.  He was subsequently diagnosed with pneumonia and treated successfully with antibiotics.  Follow-up chest x-ray showed persistent opacities and a CT scan was performed in 05/2017 which showed scattered bilateral pulmonary nodules.  At his office visit on 5/8 he reported slow improvement from his pneumonia though did note continued dyspnea on exertion and more pronounced bilateral lower extremity swelling.  Weight was noted to be 183 pounds which was up from 182 pounds in 10/2016.  He was felt to be mildly volume overloaded on exam, even in the setting of his weight only being up approximately 1 pound from prior visit.  He was started on Lasix 20 mg daily.  Follow-up BMP on 07/05/2017 showed stable serum creatinine at 1.18 and potassium 5.0.  Echo on 07/13/2017 showed an EF of 60 to 65%, no regional wall motion abnormalities, grade 2 diastolic dysfunction, mild MR, left atrium normal in size, RV systolic function normal, PASP normal.  He was maintained on Lasix 20 mg daily.  Patient comes in today noting slight improvement in his lower extremity swelling and shortness of breath.  He does feel like he is wheezing at times.  No chest pain.  He has previously been told he has venous  insufficiency and has been advised to wear compression stockings, however he does not wear these secondary to comfort.  He does try to elevate his legs when he is sitting in his house though does not always.  Lower extremity swelling is better each morning after laying in the bed overnight.  Weight remains stable.  Past Medical History:  Diagnosis Date  . (HFpEF) heart failure with preserved ejection fraction ([place])    a. 06/2017 Echo: EF 60-65%, no rwma, Gr2 DD, mild MR, nl LA size, nl RV fxn, PASP wnl.  . Coronary artery disease    a. He reports having myocardial infarction at the age of 50. He reports having multiple PCI's; b. 03/2008 Cath/PCI: LAD mild dzs, LCX 142m, RCA 85d (3.0x18 Vision BMS); c. 07/2016 MV: EF 55-65%, mid inf/inflat infarct w/o ischemia.  . Essential hypertension   . Hiatal hernia   . Hypercalcemia   . Hyperlipidemia   . Hypertension   . Ischemic heart disease   . MI (myocardial infarction) [facility])     Past Surgical History:  Procedure Laterality Date  . CARDIAC CATHETERIZATION     [facility]  . CORONARY ANGIOPLASTY WITH STENT PLACEMENT      Current Meds  Medication Sig  . amLODipine (NORVASC) 10 MG tablet Take 10 mg by mouth daily.   Marland Kitchen aspirin EC 81 MG tablet Take 81 mg by mouth daily.   . carvedilol (COREG) 6.25 MG tablet TAKE 1 TABLET TWICE A DAY  . clopidogrel (PLAVIX) 75 MG tablet  Take 75 mg by mouth daily.   . furosemide (LASIX) 20 MG tablet Take 1 tablet (20 mg total) by mouth daily.  Marland Kitchen losartan (COZAAR) 100 MG tablet Take 100 mg by mouth daily.   . magnesium oxide (MAG-OX) 400 MG tablet Take 400 mg by mouth daily.   . nitroGLYCERIN (NITROSTAT) 0.4 MG SL tablet Place 0.4 mg under the tongue every 5 (five) minutes as needed.   Marland Kitchen omeprazole (PRILOSEC) 20 MG capsule TAKE 1 CAPSULE DAILY  . sertraline (ZOLOFT) 25 MG tablet Take 25 mg by mouth daily.  . simvastatin (ZOCOR) 20 MG tablet Take 20 mg by mouth daily at 6 PM.     Allergies:   Tape   Social History:   The patient  reports that he quit smoking about 35 years ago. His smoking use included cigarettes. He has never used smokeless tobacco. He reports that he drinks alcohol. He reports that he does not use drugs.   Family History:  The patient's family history includes Hypertension in his father.  ROS:   Review of Systems  Constitutional: Positive for malaise/fatigue. Negative for chills, diaphoresis, fever and weight loss.  HENT: Negative for congestion.   Eyes: Negative for discharge and redness.  Respiratory: Positive for cough, shortness of breath and wheezing. Negative for hemoptysis and sputum production.   Cardiovascular: Positive for leg swelling. Negative for chest pain, palpitations, orthopnea, claudication and PND.  Gastrointestinal: Negative for abdominal pain, blood in stool, heartburn, melena, nausea and vomiting.  Genitourinary: Negative for hematuria.  Musculoskeletal: Negative for falls and myalgias.  Skin: Negative for rash.  Neurological: Negative for dizziness, tingling, tremors, sensory change, speech change, focal weakness, loss of consciousness and weakness.  Endo/Heme/Allergies: Does not bruise/bleed easily.  Psychiatric/Behavioral: Negative for substance abuse. The patient is not nervous/anxious.   All other systems reviewed and are negative.    PHYSICAL EXAM:  VS:  BP 126/60 (BP Location: Left Arm, Patient Position: Sitting, Cuff Size: Normal)   Pulse 62   Ht 5\' 6"  (1.676 m)   Wt 182 lb 8 oz (82.8 kg)   SpO2 97%   BMI 29.46 kg/m  BMI: Body mass index is 29.46 kg/m.  Physical Exam  Constitutional: He is oriented to person, place, and time. He appears well-developed and well-nourished.  HENT:  Head: Normocephalic and atraumatic.  Eyes: Right eye exhibits no discharge. Left eye exhibits no discharge.  Neck: Normal range of motion. No JVD present.  Cardiovascular: Normal rate, regular rhythm, S1 normal and S2 normal. Exam reveals no distant heart sounds, no  friction rub, no midsystolic click and no opening snap.  Murmur heard. High-pitched blowing holosystolic murmur is present with a grade of 1/6 at the apex. Pulses:      Posterior tibial pulses are 2+ on the right side, and 2+ on the left side.  Pulmonary/Chest: Effort normal. No respiratory distress. He has no decreased breath sounds. He has wheezes. He has no rales. He exhibits no tenderness.  Abdominal: Soft. He exhibits no distension. There is no tenderness.  Musculoskeletal: He exhibits edema.  1-2+ bilateral lower extremity swelling with chronic woody appearance.  Neurological: He is alert and oriented to person, place, and time.  Skin: Skin is warm and dry. No cyanosis. Nails show no clubbing.  Psychiatric: He has a normal mood and affect. His speech is normal and behavior is normal. Judgment and thought content normal.     EKG:  Was not ordered today.    Recent Labs: 07/05/2017:  BUN 17; Creatinine, Ser 1.18; Potassium 5.0; Sodium 142  No results found for requested labs within last 8760 hours.   Estimated Creatinine Clearance: 43.7 mL/min (by C-G formula based on SCr of 1.18 mg/dL).   Wt Readings from Last 3 Encounters:  07/21/17 182 lb 8 oz (82.8 kg)  06/28/17 183 lb 4 oz (83.1 kg)  10/31/16 182 lb 8 oz (82.8 kg)     Other studies reviewed: Additional studies/records reviewed today include: summarized above  ASSESSMENT AND PLAN:  1. Chronic diastolic CHF: He does not appear grossly volume up at this time. Weight stable. He attributes most of his SOB to wheezing at this time. Trial of albuterol inhaler today. Continue Lasix 20 mg daily. Check BMP. Optimal BP and HR control. Recent echo as above demonstrated normal LVSF and PASP with DD.   2. Lower extremity swelling: Consistent with venous insufficiency.  Patient has compression stockings at home though refuses to wear these secondary to comfort.  I discussed with him possible lymphedema boots, patient declines these stating  his wife wore them in years prior and he does not want to do that.  Advised patient to elevate legs when sitting.  Continue Lasix 20 mg daily.  Check BMP.  3. CAD: No symptoms concerning for angina at this time. Continue ASA, Plavix, Coreg, and simvastatin. No plans for ischemic evaluation at this time.   4. HTN: Blood pressure is well controlled today. No changes.   5. Pulmonary nodule: Scheduled for follow up CT later this summer.   6. Carotid artery ultrasound: Scheduled.   Disposition: F/u with myself in 3 months.  Current medicines are reviewed at length with the patient today.  The patient did not have any concerns regarding medicines.  Signed, Christell Faith, PA-C 07/21/2017 10:19 AM     [place] [address] [place] [place], [place] [address] [phone]

## 2017-07-21 ENCOUNTER — Encounter: Payer: Self-pay | Admitting: Physician Assistant

## 2017-07-21 ENCOUNTER — Ambulatory Visit (INDEPENDENT_AMBULATORY_CARE_PROVIDER_SITE_OTHER): Payer: Medicare Other | Admitting: Physician Assistant

## 2017-07-21 VITALS — BP 126/60 | HR 62 | Ht 66.0 in | Wt 182.5 lb

## 2017-07-21 DIAGNOSIS — R911 Solitary pulmonary nodule: Secondary | ICD-10-CM

## 2017-07-21 DIAGNOSIS — I10 Essential (primary) hypertension: Secondary | ICD-10-CM | POA: Diagnosis not present

## 2017-07-21 DIAGNOSIS — I2510 Atherosclerotic heart disease of native coronary artery without angina pectoris: Secondary | ICD-10-CM

## 2017-07-21 DIAGNOSIS — I5032 Chronic diastolic (congestive) heart failure: Secondary | ICD-10-CM

## 2017-07-21 DIAGNOSIS — I6523 Occlusion and stenosis of bilateral carotid arteries: Secondary | ICD-10-CM

## 2017-07-21 DIAGNOSIS — I779 Disorder of arteries and arterioles, unspecified: Secondary | ICD-10-CM | POA: Diagnosis not present

## 2017-07-21 DIAGNOSIS — I25119 Atherosclerotic heart disease of native coronary artery with unspecified angina pectoris: Secondary | ICD-10-CM | POA: Diagnosis not present

## 2017-07-21 DIAGNOSIS — I739 Peripheral vascular disease, unspecified: Secondary | ICD-10-CM

## 2017-07-21 DIAGNOSIS — I872 Venous insufficiency (chronic) (peripheral): Secondary | ICD-10-CM | POA: Diagnosis not present

## 2017-07-21 MED ORDER — ALBUTEROL SULFATE HFA 108 (90 BASE) MCG/ACT IN AERS: INHALATION_SPRAY | RESPIRATORY_TRACT | 2 refills | Status: DC

## 2017-07-21 NOTE — Patient Instructions (Signed)
Medication Instructions:  Your physician has recommended you make the following change in your medication:  1- Albuterol - Inhale 2 puffs every 4-6 hours as needed for wheezing.   Labwork: Your physician recommends that you return for lab work in: [place] (BMET).   Testing/Procedures: none  Follow-Up: Your physician recommends that you schedule a follow-up appointment in: [place].   If you need a refill on your cardiac medications before your next appointment, please call your pharmacy.

## 2017-07-22 LAB — BASIC METABOLIC PANEL
BUN/Creatinine Ratio: 13 (ref 10–24)
BUN: 16 mg/dL (ref 8–27)
CALCIUM: 9.9 mg/dL (ref 8.6–10.2)
CHLORIDE: 100 mmol/L (ref 96–106)
CO2: 24 mmol/L (ref 20–29)
Creatinine, Ser: 1.21 mg/dL (ref 0.76–1.27)
GFR calc Af Amer: 61 mL/min/{1.73_m2} (ref >59)
GFR calc non Af Amer: 53 mL/min/{1.73_m2} — ABNORMAL LOW (ref >59)
Glucose: 108 mg/dL — ABNORMAL HIGH (ref 65–99)
POTASSIUM: 4.9 mmol/L (ref 3.5–5.2)
Sodium: 137 mmol/L (ref 134–144)

## 2017-07-25 ENCOUNTER — Other Ambulatory Visit: Payer: Self-pay

## 2017-07-25 ENCOUNTER — Inpatient Hospital Stay
Admission: EM | Admit: 2017-07-25 | Discharge: 2017-07-28 | DRG: 193 | Disposition: A | Discharge status: Home Health | Payer: Medicare Other | Attending: Internal Medicine | Admitting: Internal Medicine

## 2017-07-25 ENCOUNTER — Emergency Department: Payer: Medicare Other

## 2017-07-25 ENCOUNTER — Encounter: Payer: Self-pay | Admitting: Emergency Medicine

## 2017-07-25 DIAGNOSIS — Z955 Presence of coronary angioplasty implant and graft: Secondary | ICD-10-CM | POA: Diagnosis not present

## 2017-07-25 DIAGNOSIS — Z66 Do not resuscitate: Secondary | ICD-10-CM | POA: Diagnosis present

## 2017-07-25 DIAGNOSIS — Z91048 Other nonmedicinal substance allergy status: Secondary | ICD-10-CM

## 2017-07-25 DIAGNOSIS — I340 Nonrheumatic mitral (valve) insufficiency: Secondary | ICD-10-CM | POA: Diagnosis not present

## 2017-07-25 DIAGNOSIS — I252 Old myocardial infarction: Secondary | ICD-10-CM | POA: Diagnosis not present

## 2017-07-25 DIAGNOSIS — Z8249 Family history of ischemic heart disease and other diseases of the circulatory system: Secondary | ICD-10-CM | POA: Diagnosis not present

## 2017-07-25 DIAGNOSIS — Z87891 Personal history of nicotine dependence: Secondary | ICD-10-CM

## 2017-07-25 DIAGNOSIS — Z7902 Long term (current) use of antithrombotics/antiplatelets: Secondary | ICD-10-CM | POA: Diagnosis not present

## 2017-07-25 DIAGNOSIS — J189 Pneumonia, unspecified organism: Secondary | ICD-10-CM | POA: Diagnosis not present

## 2017-07-25 DIAGNOSIS — I110 Hypertensive heart disease with heart failure: Secondary | ICD-10-CM | POA: Diagnosis present

## 2017-07-25 DIAGNOSIS — I872 Venous insufficiency (chronic) (peripheral): Secondary | ICD-10-CM | POA: Diagnosis present

## 2017-07-25 DIAGNOSIS — N179 Acute kidney failure, unspecified: Secondary | ICD-10-CM | POA: Diagnosis present

## 2017-07-25 DIAGNOSIS — J440 Chronic obstructive pulmonary disease with acute lower respiratory infection: Secondary | ICD-10-CM | POA: Diagnosis present

## 2017-07-25 DIAGNOSIS — R011 Cardiac murmur, unspecified: Secondary | ICD-10-CM | POA: Diagnosis present

## 2017-07-25 DIAGNOSIS — A419 Sepsis, unspecified organism: Secondary | ICD-10-CM

## 2017-07-25 DIAGNOSIS — I10 Essential (primary) hypertension: Secondary | ICD-10-CM | POA: Diagnosis not present

## 2017-07-25 DIAGNOSIS — J441 Chronic obstructive pulmonary disease with (acute) exacerbation: Secondary | ICD-10-CM | POA: Diagnosis present

## 2017-07-25 DIAGNOSIS — R0902 Hypoxemia: Secondary | ICD-10-CM | POA: Diagnosis not present

## 2017-07-25 DIAGNOSIS — R911 Solitary pulmonary nodule: Secondary | ICD-10-CM | POA: Diagnosis present

## 2017-07-25 DIAGNOSIS — E785 Hyperlipidemia, unspecified: Secondary | ICD-10-CM | POA: Diagnosis present

## 2017-07-25 DIAGNOSIS — Z7982 Long term (current) use of aspirin: Secondary | ICD-10-CM | POA: Diagnosis not present

## 2017-07-25 DIAGNOSIS — I248 Other forms of acute ischemic heart disease: Secondary | ICD-10-CM | POA: Diagnosis present

## 2017-07-25 DIAGNOSIS — I2510 Atherosclerotic heart disease of native coronary artery without angina pectoris: Secondary | ICD-10-CM | POA: Diagnosis present

## 2017-07-25 DIAGNOSIS — R0602 Shortness of breath: Secondary | ICD-10-CM | POA: Diagnosis not present

## 2017-07-25 DIAGNOSIS — J9601 Acute respiratory failure with hypoxia: Secondary | ICD-10-CM | POA: Diagnosis present

## 2017-07-25 DIAGNOSIS — I5032 Chronic diastolic (congestive) heart failure: Secondary | ICD-10-CM | POA: Diagnosis present

## 2017-07-25 DIAGNOSIS — Z79899 Other long term (current) drug therapy: Secondary | ICD-10-CM | POA: Diagnosis not present

## 2017-07-25 DIAGNOSIS — J181 Lobar pneumonia, unspecified organism: Principal | ICD-10-CM | POA: Diagnosis present

## 2017-07-25 DIAGNOSIS — I25118 Atherosclerotic heart disease of native coronary artery with other forms of angina pectoris: Secondary | ICD-10-CM | POA: Diagnosis not present

## 2017-07-25 LAB — COMPREHENSIVE METABOLIC PANEL
ALBUMIN: 3.1 g/dL — AB (ref 3.5–5.0)
ALK PHOS: 96 U/L (ref 38–126)
ALT: 22 U/L (ref 17–63)
ANION GAP: 10 (ref 5–15)
AST: 37 U/L (ref 15–41)
BUN: 19 mg/dL (ref 6–20)
CALCIUM: 8.8 mg/dL — AB (ref 8.9–10.3)
CHLORIDE: 98 mmol/L — AB (ref 101–111)
CO2: 24 mmol/L (ref 22–32)
Creatinine, Ser: 1.34 mg/dL — ABNORMAL HIGH (ref 0.61–1.24)
GFR calc non Af Amer: 46 mL/min — ABNORMAL LOW (ref >60)
GFR, EST AFRICAN AMERICAN: 53 mL/min — AB (ref >60)
GLUCOSE: 154 mg/dL — AB (ref 65–99)
POTASSIUM: 3.8 mmol/L (ref 3.5–5.1)
SODIUM: 132 mmol/L — AB (ref 135–145)
Total Bilirubin: 1.5 mg/dL — ABNORMAL HIGH (ref 0.3–1.2)
Total Protein: 7.4 g/dL (ref 6.5–8.1)

## 2017-07-25 LAB — PROTIME-INR
INR: 1.13
PROTHROMBIN TIME: 14.4 s (ref 11.4–15.2)

## 2017-07-25 LAB — CBC
HCT: 32.9 % — ABNORMAL LOW (ref 40.0–52.0)
Hemoglobin: 11 g/dL — ABNORMAL LOW (ref 13.0–18.0)
MCH: 28.2 pg (ref 26.0–34.0)
MCHC: 33.3 g/dL (ref 32.0–36.0)
MCV: 84.6 fL (ref 80.0–100.0)
PLATELETS: 162 10*3/uL (ref 150–440)
RBC: 3.89 MIL/uL — AB (ref 4.40–5.90)
RDW: 13.1 % (ref 11.5–14.5)
WBC: 10.2 10*3/uL (ref 3.8–10.6)

## 2017-07-25 LAB — LACTIC ACID, PLASMA: LACTIC ACID, VENOUS: 1.7 mmol/L (ref 0.5–1.9)

## 2017-07-25 LAB — TROPONIN I
Troponin I: 0.21 ng/mL (ref <0.03)
Troponin I: 0.18 ng/mL (ref <0.03)

## 2017-07-25 MED ORDER — SODIUM CHLORIDE 0.9 % IV SOLN
500.0000 mg | INTRAVENOUS | Status: DC
  Administered 2017-07-25: 500 mg via INTRAVENOUS
  Filled 2017-07-25 (×2): qty 500

## 2017-07-25 MED ORDER — ASPIRIN EC 81 MG PO TBEC
81.0000 mg | DELAYED_RELEASE_TABLET | Freq: Every day | ORAL | Status: DC
  Administered 2017-07-25 – 2017-07-28 (×4): 81 mg via ORAL
  Filled 2017-07-25 (×4): qty 1

## 2017-07-25 MED ORDER — NITROGLYCERIN 0.4 MG SL SUBL: 0.4000 mg | SUBLINGUAL_TABLET | SUBLINGUAL | Status: DC | PRN

## 2017-07-25 MED ORDER — CLOPIDOGREL BISULFATE 75 MG PO TABS
75.0000 mg | ORAL_TABLET | Freq: Every day | ORAL | Status: DC
  Administered 2017-07-25 – 2017-07-28 (×4): 75 mg via ORAL
  Filled 2017-07-25 (×5): qty 1

## 2017-07-25 MED ORDER — ORAL CARE MOUTH RINSE
15.0000 mL | Freq: Two times a day (BID) | OROMUCOSAL | Status: DC
  Administered 2017-07-25 – 2017-07-28 (×6): 15 mL via OROMUCOSAL

## 2017-07-25 MED ORDER — PANTOPRAZOLE SODIUM 40 MG PO TBEC
40.0000 mg | DELAYED_RELEASE_TABLET | Freq: Every day | ORAL | Status: DC
  Administered 2017-07-25 – 2017-07-28 (×4): 40 mg via ORAL
  Filled 2017-07-25 (×4): qty 1

## 2017-07-25 MED ORDER — AMLODIPINE BESYLATE 10 MG PO TABS
10.0000 mg | ORAL_TABLET | Freq: Every day | ORAL | Status: DC
  Administered 2017-07-26 – 2017-07-28 (×3): 10 mg via ORAL
  Filled 2017-07-25 (×5): qty 1

## 2017-07-25 MED ORDER — METHYLPREDNISOLONE SODIUM SUCC 125 MG IJ SOLR
60.0000 mg | Freq: Four times a day (QID) | INTRAMUSCULAR | Status: DC
  Administered 2017-07-25 – 2017-07-28 (×12): 60 mg via INTRAVENOUS
  Filled 2017-07-25 (×12): qty 2

## 2017-07-25 MED ORDER — SIMVASTATIN 10 MG PO TABS
20.0000 mg | ORAL_TABLET | Freq: Every day | ORAL | Status: DC
  Administered 2017-07-25: 20 mg via ORAL
  Filled 2017-07-25 (×2): qty 2

## 2017-07-25 MED ORDER — MORPHINE SULFATE (PF) 2 MG/ML IV SOLN: 2.0000 mg | INTRAVENOUS | Status: DC | PRN

## 2017-07-25 MED ORDER — GUAIFENESIN ER 600 MG PO TB12
600.0000 mg | ORAL_TABLET | Freq: Two times a day (BID) | ORAL | Status: DC
  Administered 2017-07-25 – 2017-07-28 (×6): 600 mg via ORAL
  Filled 2017-07-25 (×6): qty 1

## 2017-07-25 MED ORDER — ENOXAPARIN SODIUM 40 MG/0.4ML ~~LOC~~ SOLN
40.0000 mg | SUBCUTANEOUS | Status: DC
  Administered 2017-07-25 – 2017-07-27 (×3): 40 mg via SUBCUTANEOUS
  Filled 2017-07-25 (×3): qty 0.4

## 2017-07-25 MED ORDER — SERTRALINE HCL 50 MG PO TABS
25.0000 mg | ORAL_TABLET | Freq: Every day | ORAL | Status: DC
  Administered 2017-07-25 – 2017-07-28 (×4): 25 mg via ORAL
  Filled 2017-07-25 (×4): qty 1

## 2017-07-25 MED ORDER — BUDESONIDE 0.5 MG/2ML IN SUSP
0.5000 mg | Freq: Two times a day (BID) | RESPIRATORY_TRACT | Status: DC
  Administered 2017-07-25 – 2017-07-28 (×6): 0.5 mg via RESPIRATORY_TRACT
  Filled 2017-07-25 (×6): qty 2

## 2017-07-25 MED ORDER — SODIUM CHLORIDE 0.9 % IV SOLN
2.0000 g | INTRAVENOUS | Status: DC
  Administered 2017-07-25: 2 g via INTRAVENOUS
  Filled 2017-07-25 (×2): qty 20

## 2017-07-25 MED ORDER — FUROSEMIDE 20 MG PO TABS
20.0000 mg | ORAL_TABLET | Freq: Every day | ORAL | Status: DC
  Administered 2017-07-25 – 2017-07-26 (×2): 20 mg via ORAL
  Filled 2017-07-25 (×2): qty 1

## 2017-07-25 MED ORDER — LACTATED RINGERS IV SOLN
INTRAVENOUS | Status: DC
  Administered 2017-07-25 – 2017-07-26 (×2): via INTRAVENOUS

## 2017-07-25 MED ORDER — LOSARTAN POTASSIUM 50 MG PO TABS
100.0000 mg | ORAL_TABLET | Freq: Every day | ORAL | Status: DC
  Administered 2017-07-25 – 2017-07-28 (×4): 100 mg via ORAL
  Filled 2017-07-25 (×4): qty 2

## 2017-07-25 MED ORDER — IPRATROPIUM-ALBUTEROL 0.5-2.5 (3) MG/3ML IN SOLN
3.0000 mL | Freq: Four times a day (QID) | RESPIRATORY_TRACT | Status: DC
  Administered 2017-07-25 – 2017-07-26 (×2): 3 mL via RESPIRATORY_TRACT
  Filled 2017-07-25 (×2): qty 3

## 2017-07-25 MED ORDER — MAGNESIUM OXIDE 400 (241.3 MG) MG PO TABS
400.0000 mg | ORAL_TABLET | Freq: Every day | ORAL | Status: DC
  Administered 2017-07-25 – 2017-07-28 (×4): 400 mg via ORAL
  Filled 2017-07-25 (×5): qty 1

## 2017-07-25 MED ORDER — SODIUM CHLORIDE 0.9 % IV SOLN
500.0000 mg | INTRAVENOUS | Status: DC
  Administered 2017-07-26 – 2017-07-27 (×2): 500 mg via INTRAVENOUS
  Filled 2017-07-25 (×3): qty 500

## 2017-07-25 MED ORDER — CARVEDILOL 6.25 MG PO TABS
6.2500 mg | ORAL_TABLET | Freq: Two times a day (BID) | ORAL | Status: DC
  Administered 2017-07-25 – 2017-07-28 (×6): 6.25 mg via ORAL
  Filled 2017-07-25 (×6): qty 1

## 2017-07-25 MED ORDER — SODIUM CHLORIDE 0.9 % IV SOLN
1.0000 g | INTRAVENOUS | Status: DC
  Administered 2017-07-26 – 2017-07-27 (×2): 1 g via INTRAVENOUS
  Filled 2017-07-25: qty 10
  Filled 2017-07-25 (×2): qty 1

## 2017-07-25 NOTE — ED Provider Notes (Signed)
[facility] Emergency Department Provider Note   ____________________________________________   First MD Initiated Contact with Patient 07/25/17 1553     (approximate)  I have reviewed the triage vital signs and the nursing notes.   HISTORY  Chief Complaint Shortness of Breath    HPI Anthony Johns is a 84 y.o. male presents for evaluation for fever and shortness of breath  Patient is experiencing shortness of breath, having shortness of breath for several months but suddenly worsening over about the last week with increasing shortness of breath cough and fevers.  Chills at home.  Slowly worsening last few days.  Improved with albuterol treatments by EMS.  Was wheezing some, but reports that is gotten better after treatments.  Also given about 1 g Tylenol by EMS    Past Medical History:  Diagnosis Date  . (HFpEF) heart failure with preserved ejection fraction ([place])    a. 06/2017 Echo: EF 60-65%, no rwma, Gr2 DD, mild MR, nl LA size, nl RV fxn, PASP wnl.  . Coronary artery disease    a. He reports having myocardial infarction at the age of 51. He reports having multiple PCI's; b. 03/2008 Cath/PCI: LAD mild dzs, LCX 134m, RCA 85d (3.0x18 Vision BMS); c. 07/2016 MV: EF 55-65%, mid inf/inflat infarct w/o ischemia.  . Essential hypertension   . Hiatal hernia   . Hypercalcemia   . Hyperlipidemia   . Hypertension   . Ischemic heart disease   . MI (myocardial infarction) [place])     Patient Active Problem List   Diagnosis Date Noted  . CAP (community acquired pneumonia) 07/25/2017  . Coronary artery disease involving native coronary artery with angina pectoris ([place]) 11/13/2014  . Right carotid bruit 11/13/2014  . Hyperlipidemia   . Essential hypertension     Past Surgical History:  Procedure Laterality Date  . CARDIAC CATHETERIZATION     [facility]  . CORONARY ANGIOPLASTY WITH STENT PLACEMENT      Prior to Admission medications   Medication Sig  Start Date End Date Taking? Authorizing Provider  albuterol (PROVENTIL HFA;VENTOLIN HFA) 108 (90 Base) MCG/ACT inhaler Inhale 2 puffs every 4-6 hours as needed for wheezing. 07/21/17  Yes Dunn, Areta Haber, PA-C  amLODipine (NORVASC) 10 MG tablet Take 10 mg by mouth daily.  04/21/14  Yes [provider]  aspirin EC 81 MG tablet Take 81 mg by mouth daily.    Yes [provider]  carvedilol (COREG) 6.25 MG tablet TAKE 1 TABLET TWICE A DAY 05/29/17  Yes Wellington Hampshire, MD  clopidogrel (PLAVIX) 75 MG tablet Take 75 mg by mouth daily.  03/10/14  Yes [provider]  furosemide (LASIX) 20 MG tablet Take 1 tablet (20 mg total) by mouth daily. 06/28/17 09/26/17 Yes Theora Gianotti, NP  losartan (COZAAR) 100 MG tablet Take 100 mg by mouth daily.  04/21/14  Yes [provider]  magnesium oxide (MAG-OX) 400 MG tablet Take 400 mg by mouth daily.    Yes [provider]  nitroGLYCERIN (NITROSTAT) 0.4 MG SL tablet Place 0.4 mg under the tongue every 5 (five) minutes as needed.  04/21/14  Yes [provider]  omeprazole (PRILOSEC) 20 MG capsule TAKE 1 CAPSULE DAILY 11/13/13  Yes [provider]  sertraline (ZOLOFT) 25 MG tablet Take 25 mg by mouth daily. 02/15/17  Yes [provider]  simvastatin (ZOCOR) 20 MG tablet Take 20 mg by mouth daily at 6 PM.  04/21/14  Yes [provider]    Allergies Tape  Family History  Problem Relation Age of Onset  . Hypertension Father     Social History Social History   Tobacco Use  . Smoking status: Former Smoker    Types: Cigarettes    Last attempt to quit: 03/25/1982    Years since quitting: 35.3  . Smokeless tobacco: Never Used  Substance Use Topics  . Alcohol use: Yes    Comment: occas.   . Drug use: No    Review of Systems Constitutional: Fevers chills and fatigue eyes: No visual changes. ENT: No sore throat. Cardiovascular: Denies chest pain. Respiratory: See HPI  gastrointestinal: No abdominal pain.  No nausea, no vomiting.  No diarrhea.  No constipation. Genitourinary: Negative for dysuria. Musculoskeletal: Negative for back pain. Skin: Negative for rash. Neurological: Negative for headaches, focal weakness or numbness.    ____________________________________________   PHYSICAL EXAM:  VITAL SIGNS: ED Triage Vitals  Enc Vitals Group     BP 07/25/17 1452 (!) 116/56     Pulse Rate 07/25/17 1452 89     Resp 07/25/17 1452 (!) 25     Temp 07/25/17 1452 (!) 102.5 F (39.2 C)     Temp Source 07/25/17 1452 Anthony     SpO2 07/25/17 1452 95 %     Weight 07/25/17 1454 180 lb (81.6 kg)     Height 07/25/17 1454 5\' 6"  (1.676 m)     Head Circumference --      Peak Flow --      Pain Score 07/25/17 1454 0     Pain Loc --      Pain Edu? --      Excl. in [place]? --     Constitutional: Alert and oriented.  Mildly ill-appearing.  No distress.  92% saturation on 2 L nasal cannula eyes: Conjunctivae are normal. Head: Atraumatic. Nose: No congestion/rhinnorhea. Mouth/Throat: Mucous membranes are slightly dry. Neck: No stridor.   Cardiovascular: Normal rate, regular rhythm. Grossly normal heart sounds.  Good peripheral circulation. Respiratory: Very mild tachypnea but otherwise normal effort.  No retractions. Lungs CTAB though slightly diminished over the bases bilateral.  There is no wheezing Gastrointestinal: Soft and nontender. No distention. Musculoskeletal: No lower extremity tenderness nor edema.  Patient reports swelling his legs been a lot better in the last couple of days Neurologic:  Normal speech and language. No gross focal neurologic deficits are appreciated.  Skin:  Skin is warm, dry and intact. No rash noted. Psychiatric: Mood and affect are normal. Speech and behavior are normal.  ____________________________________________   LABS (all labs ordered are listed, but only abnormal results are displayed)  Labs Reviewed  CBC - Abnormal;  Notable for the following components:      Result Value   RBC 3.89 (*)    Hemoglobin 11.0 (*)    HCT 32.9 (*)    All other components within normal limits  CULTURE, BLOOD (ROUTINE X 2)  CULTURE, BLOOD (ROUTINE X 2)  LACTIC ACID, PLASMA  PROTIME-INR  COMPREHENSIVE METABOLIC PANEL  TROPONIN I  LACTIC ACID, PLASMA  URINALYSIS, COMPLETE (UACMP) WITH MICROSCOPIC   ____________________________________________  EKG  Reviewed and entered by me at 1500 Heart rate 90 QRS 89 QTC 400 Normal sinus rhythm, no evidence of ischemia ____________________________________________  RADIOLOGY    Chest x-ray results reviewed.  Chronic findings denoted, but also pneumonia also denoted primarily in the left lower lobe ____________________________________________   PROCEDURES  Procedure(s) performed: None  Procedures  Critical Care performed:  Yes, see critical care note(s)  CRITICAL CARE Performed by: Delman Kitten   Total critical care time: 35 minutes  Critical care time was exclusive of separately billable procedures and treating other patients.  Critical care was necessary to treat or prevent imminent or life-threatening deterioration.  Critical care was time spent personally by me on the following activities: development of treatment plan with patient and/or surrogate as well as nursing, discussions with consultants, evaluation of patient's response to treatment, examination of patient, obtaining history from patient or surrogate, ordering and performing treatments and interventions, ordering and review of laboratory studies, ordering and review of radiographic studies, pulse oximetry and re-evaluation of patient's condition.  ____________________________________________   INITIAL IMPRESSION / ASSESSMENT AND PLAN / ED COURSE  Pertinent labs & imaging results that were available during my care of the patient were reviewed by me and considered in my medical decision making (see chart  for details).  Patient presents for shortness of breath cough and fever.  Recently evaluated and started on treatment for reactive airway disease as well as Lasix therapy.  No signs or symptoms of congestive heart failure today.  No active evidence of bronchospasm but was treated for bronchospasm by EMS with improvement.  He is hypoxic on room air.  He corrects well on nasal cannula.  With his fever, chest x-ray findings and clinical history appears consistent with community-acquired pneumonia.  Reports he has not been hospitalized recently, does not live in a healthcare environment.  Patient initiated down code sepsis due to meeting criteria of sepsis, stool antibiotics given.  Patient and family agreeable and understanding of diagnosis and plan for admission.      ____________________________________________   FINAL CLINICAL IMPRESSION(S) / ED DIAGNOSES  Final diagnoses:  Community acquired pneumonia of left lower lobe of lung ([place])  Sepsis, due to unspecified organism ([place])  Hypoxia      NEW MEDICATIONS STARTED DURING THIS VISIT:  New Prescriptions   No medications on file     Note:  This document was prepared using Dragon voice recognition software and may include unintentional dictation errors.     Delman Kitten, MD 07/25/17 [phone]

## 2017-07-25 NOTE — H&P (Addendum)
[facility] [place] NAME: Anthony Johns    MR#:  [id number]  DATE OF BIRTH:  Sep 21, 1927  DATE OF ADMISSION:  07/25/2017  PRIMARY CARE PHYSICIAN: Leonel Ramsay, MD   REQUESTING/REFERRING PHYSICIAN:   CHIEF COMPLAINT:   Chief Complaint  Patient presents with  . Shortness of Breath    HISTORY OF PRESENT ILLNESS: Anthony Johns  is a 84 y.o. male with a known history per below, not feeling well for 1 to 2 months, dyspnea on exertion, cough, generalized weakness, fatigue, chills, sweating, in the emergency room patient was noted to have fever 102.5, tachypnea, chest x-ray noted for left pneumonia, patient with remote smoking history but extensive-currently non-smoking, patient evaluated in the emergency room, granddaughter at the bedside, patient in mild distress, noted hypoxia with O2 saturation in the 80s on presentation, currently stable on 3 L via nasal cannula, patient is now been admitted for acute hypoxic respiratory failure secondary to community-acquired left-sided pneumonia and probable acute on COPD exacerbation  PAST MEDICAL HISTORY:   Past Medical History:  Diagnosis Date  . (HFpEF) heart failure with preserved ejection fraction ([place])    a. 06/2017 Echo: EF 60-65%, no rwma, Gr2 DD, mild MR, nl LA size, nl RV fxn, PASP wnl.  . Coronary artery disease    a. He reports having myocardial infarction at the age of 25. He reports having multiple PCI's; b. 03/2008 Cath/PCI: LAD mild dzs, LCX 152m, RCA 85d (3.0x18 Vision BMS); c. 07/2016 MV: EF 55-65%, mid inf/inflat infarct w/o ischemia.  . Essential hypertension   . Hiatal hernia   . Hypercalcemia   . Hyperlipidemia   . Hypertension   . Ischemic heart disease   . MI (myocardial infarction) ([place])     PAST SURGICAL HISTORY:  Past Surgical History:  Procedure Laterality Date  . CARDIAC CATHETERIZATION     [facility]  . CORONARY ANGIOPLASTY WITH STENT PLACEMENT      SOCIAL HISTORY:   Social History   Tobacco Use  . Smoking status: Former Smoker    Types: Cigarettes    Last attempt to quit: 03/25/1982    Years since quitting: 35.3  . Smokeless tobacco: Never Used  Substance Use Topics  . Alcohol use: Yes    Comment: occas.     FAMILY HISTORY:  Family History  Problem Relation Age of Onset  . Hypertension Father     DRUG ALLERGIES:  Allergies  Allergen Reactions  . Tape     REVIEW OF SYSTEMS:   CONSTITUTIONAL: +fever, fatigue, weakness.  EYES: No blurred or double vision.  EARS, NOSE, AND THROAT: No tinnitus or ear pain.  RESPIRATORY: + cough, shortness of breath, wheezing, no hemoptysis.  CARDIOVASCULAR: No chest pain, orthopnea, edema.  GASTROINTESTINAL: No nausea, vomiting, diarrhea or abdominal pain.  GENITOURINARY: No dysuria, hematuria.  ENDOCRINE: No polyuria, nocturia,  HEMATOLOGY: No anemia, easy bruising or bleeding SKIN: No rash or lesion. MUSCULOSKELETAL: No joint pain or arthritis.   NEUROLOGIC: No tingling, numbness, weakness.  PSYCHIATRY: No anxiety or depression.   MEDICATIONS AT HOME:  Prior to Admission medications   Medication Sig Start Date End Date Taking? Authorizing Provider  albuterol (PROVENTIL HFA;VENTOLIN HFA) 108 (90 Base) MCG/ACT inhaler Inhale 2 puffs every 4-6 hours as needed for wheezing. 07/21/17  Yes Dunn, Areta Haber, PA-C  amLODipine (NORVASC) 10 MG tablet Take 10 mg by mouth daily.  04/21/14  Yes [provider]  aspirin EC 81 MG tablet Take  81 mg by mouth daily.    Yes [provider]  carvedilol (COREG) 6.25 MG tablet TAKE 1 TABLET TWICE A DAY 05/29/17  Yes Wellington Hampshire, MD  clopidogrel (PLAVIX) 75 MG tablet Take 75 mg by mouth daily.  03/10/14  Yes [provider]  furosemide (LASIX) 20 MG tablet Take 1 tablet (20 mg total) by mouth daily. 06/28/17 09/26/17 Yes Theora Gianotti, NP  losartan (COZAAR) 100 MG tablet Take 100 mg by mouth daily.  04/21/14  Yes [provider]   magnesium oxide (MAG-OX) 400 MG tablet Take 400 mg by mouth daily.    Yes [provider]  nitroGLYCERIN (NITROSTAT) 0.4 MG SL tablet Place 0.4 mg under the tongue every 5 (five) minutes as needed.  04/21/14  Yes [provider]  omeprazole (PRILOSEC) 20 MG capsule TAKE 1 CAPSULE DAILY 11/13/13  Yes [provider]  sertraline (ZOLOFT) 25 MG tablet Take 25 mg by mouth daily. 02/15/17  Yes [provider]  simvastatin (ZOCOR) 20 MG tablet Take 20 mg by mouth daily at 6 PM.  04/21/14  Yes [provider]      PHYSICAL EXAMINATION:   VITAL SIGNS: Blood pressure (!) 116/56, pulse 89, temperature (!) 102.5 F (39.2 C), temperature source Oral, resp. rate (!) 25, height 5\' 6"  (1.676 m), weight 81.6 kg (180 lb), SpO2 95 %.  GENERAL:  84 y.o.-year-old patient lying in the bed with no acute distress. + Diaphoretic EYES: Pupils equal, round, reactive to light and accommodation. No scleral icterus. Extraocular muscles intact.  HEENT: Head atraumatic, normocephalic. Oropharynx and nasopharynx clear.  NECK:  Supple, no jugular venous distention. No thyroid enlargement, no tenderness.  LUNGS: Diminished breath sounds with bilateral diffuse wheezing/rhonchi.  Mild  use of accessory muscles of respiration.  CARDIOVASCULAR: S1, S2 normal. No murmurs, rubs, or gallops.  ABDOMEN: Soft, nontender, nondistended. Bowel sounds present. No organomegaly or mass.  EXTREMITIES: No pedal edema, cyanosis, or clubbing.  NEUROLOGIC: Cranial nerves II through XII are intact. MAES. Gait not checked.  PSYCHIATRIC: The patient is alert and oriented x 3.  SKIN: No obvious rash, lesion, or ulcer.   LABORATORY PANEL:   CBC Recent Labs  Lab 07/25/17 1539  WBC 10.2  HGB 11.0*  HCT 32.9*  PLT 162  MCV 84.6  MCH 28.2  MCHC 33.3  RDW 13.1   ------------------------------------------------------------------------------------------------------------------  Chemistries   Recent Labs  Lab 07/21/17 1045  NA 137  K 4.9  CL 100  CO2 24  GLUCOSE 108*  BUN 16  CREATININE 1.21  CALCIUM 9.9   ------------------------------------------------------------------------------------------------------------------ estimated creatinine clearance is 42.3 mL/min (by C-G formula based on SCr of 1.21 mg/dL). ------------------------------------------------------------------------------------------------------------------ No results for input(s): TSH, T4TOTAL, T3FREE, THYROIDAB in the last 72 hours.  Invalid input(s): FREET3   Coagulation profile Recent Labs  Lab 07/25/17 1539  INR 1.13   ------------------------------------------------------------------------------------------------------------------- No results for input(s): DDIMER in the last 72 hours. -------------------------------------------------------------------------------------------------------------------  Cardiac Enzymes No results for input(s): CKMB, TROPONINI, MYOGLOBIN in the last 168 hours.  Invalid input(s): CK ------------------------------------------------------------------------------------------------------------------ Invalid input(s): POCBNP  ---------------------------------------------------------------------------------------------------------------  Urinalysis No results found for: COLORURINE, APPEARANCEUR, LABSPEC, PHURINE, GLUCOSEU, HGBUR, BILIRUBINUR, KETONESUR, PROTEINUR, UROBILINOGEN, NITRITE, LEUKOCYTESUR   RADIOLOGY: Dg Chest 2 View  Result Date: 07/25/2017 CLINICAL DATA:  Shortness of breath for the past 2-3 weeks. Episode of pneumonia 2 months ago. Former smoker. EXAM: CHEST - 2 VIEW COMPARISON:  Chest x-ray of July 24, 2015 and CT scan chest of May 25, 2017.  FINDINGS: The lungs are adequately inflated. There is an infiltrate in the left lower lobe posteriorly. The interstitial markings elsewhere are more conspicuous today. The heart is normal in size. The pulmonary  vascularity is not engorged. There is calcification in the wall of the aortic arch. The bony thorax exhibits no acute abnormality. IMPRESSION: Left lower lobe pneumonia. Underlying chronic bronchitic-smoking related changes. No evidence of pulmonary edema. Followup PA and lateral chest X-ray is recommended in 3-4 weeks following trial of antibiotic therapy to ensure resolution and exclude underlying malignancy. Thoracic aortic atherosclerosis. Electronically Signed   By: David  Martinique M.D.   On: 07/25/2017 15:34    EKG: Orders placed or performed during the hospital encounter of 07/25/17  . ED EKG  . ED EKG  . EKG 12-Lead  . EKG 12-Lead    IMPRESSION AND PLAN: *Acute hypoxic respiratory failure Secondary to pneumonia and acute probable COPD exacerbation Supplemental oxygen with weaning as tolerated  *Acute left community-acquired pneumonia Pneumonia protocol, empiric Rocephin/azithromycin, follow-up on cultures  *Probable acute on chronic COPD exacerbation IV Solu-Medrol with tapering as tolerated, mucolytic agents, aggressive pulmonary toilet with bronchodilator therapy, inhaled corticosteroids twice daily, respiratory therapy to see, check sputum cultures, wean O2 off as tolerated  *Acute elevated troponins w/ CAD Most likely secondary to demand ischemia due to above Will cycle cardiac enzymes, continue DAPT with aspirin/Plavix, Coreg, losartan, nitrates as needed, statin therapy-check lipids in the morning, PRN nitrates, IV morphine PRN breakthrough pain, cardiology to see, echocardiogram, and continue close medical monitoring  *Chronic benign essential hypertension Stable Continue current regiment    All the records are reviewed and case discussed with ED provider. Management plans discussed with the patient, family and they are in agreement.  CODE STATUS:DNR    TOTAL TIME TAKING CARE OF THIS PATIENT: 75minutes.    Avel Peace Tyrina Hines M.D on 07/25/2017   Between 7am to 6pm  - Pager - [phone]  After 6pm go to [email or web address] - password [email or web address] [facility]  Office  [phone]  CC: Primary care physician; Leonel Ramsay, MD   Note: This dictation was prepared with Dragon dictation along with smaller phrase technology. Any transcriptional errors that result from this process are unintentional.

## 2017-07-25 NOTE — Progress Notes (Signed)
Family Meeting Note  Advance Directive:yes  Today a meeting took place with the Patient.  Patient is able to participate   The following clinical team members were present during this meeting:MD  The following were discussed:Patient's diagnosis: Pneumonia, respiratory failure, coronary artery disease, hypertension, hyperlipidemia, COPD, Patient's progosis: Unable to determine and Goals for treatment: DNR  Additional follow-up to be provided: prn  Time spent during discussion:20 minutes  Gorden Harms, MD

## 2017-07-25 NOTE — ED Triage Notes (Signed)
Pt to ED via EMS from home c/o SOB x1 month progressively getting worse.  Patient 88% RA per fire department on scene, patient given 2 duoneb treatment, 125mg  solumedrol, 975mg  tylenol per EMS and placed on 3L [place].  Temp 100.6, CBG 180 per EMS.  Pt 95% RA upon arrival, denies pain, chest rise even and unlabored, has to catch his breath some while speaking.

## 2017-07-25 NOTE — ED Notes (Signed)
Date and time results received: 07/25/17 4:41 PM (use smartphrase ".now" to insert current time)  Test: Troponin Critical Value: 0.21  Name of Provider Notified: Dr. Jerelyn Charles  Orders Received? Or Actions Taken?: No new orders at this time

## 2017-07-25 NOTE — Progress Notes (Signed)
CODE SEPSIS - PHARMACY COMMUNICATION  **Broad Spectrum Antibiotics should be administered within 1 hour of Sepsis diagnosis**  Time Code Sepsis Called/Page Received: 1521  Antibiotics Ordered: azithromycin and ceftriaxone  Time of 1st antibiotic administration: 1603  Additional action taken by pharmacy: none required  If necessary, Name of Provider/Nurse Contacted: N/A    Dallie Piles ,PharmD Clinical Pharmacist  07/25/2017  4:22 PM

## 2017-07-26 ENCOUNTER — Inpatient Hospital Stay (HOSPITAL_COMMUNITY)
Admit: 2017-07-26 | Discharge: 2017-07-26 | Disposition: A | Discharge status: Home / Self Care | Payer: Medicare Other | Attending: Family Medicine | Admitting: Family Medicine

## 2017-07-26 DIAGNOSIS — I248 Other forms of acute ischemic heart disease: Secondary | ICD-10-CM

## 2017-07-26 DIAGNOSIS — I340 Nonrheumatic mitral (valve) insufficiency: Secondary | ICD-10-CM

## 2017-07-26 DIAGNOSIS — J189 Pneumonia, unspecified organism: Secondary | ICD-10-CM

## 2017-07-26 DIAGNOSIS — I25118 Atherosclerotic heart disease of native coronary artery with other forms of angina pectoris: Secondary | ICD-10-CM

## 2017-07-26 DIAGNOSIS — R0902 Hypoxemia: Secondary | ICD-10-CM

## 2017-07-26 LAB — ECHOCARDIOGRAM COMPLETE
Height: 66 in
Weight: 2880 oz

## 2017-07-26 LAB — CBC WITH DIFFERENTIAL/PLATELET
Basophils Absolute: 0 10*3/uL (ref 0–0.1)
Basophils Relative: 0 %
EOS ABS: 0 10*3/uL (ref 0–0.7)
EOS PCT: 0 %
HEMATOCRIT: 33.1 % — AB (ref 40.0–52.0)
HEMOGLOBIN: 11.1 g/dL — AB (ref 13.0–18.0)
LYMPHS PCT: 5 %
Lymphs Abs: 0.5 10*3/uL — ABNORMAL LOW (ref 1.0–3.6)
MCH: 28.3 pg (ref 26.0–34.0)
MCHC: 33.4 g/dL (ref 32.0–36.0)
MCV: 84.5 fL (ref 80.0–100.0)
Monocytes Absolute: 0.5 10*3/uL (ref 0.2–1.0)
Monocytes Relative: 5 %
Neutro Abs: 9 10*3/uL — ABNORMAL HIGH (ref 1.4–6.5)
Neutrophils Relative %: 90 %
Platelets: 152 10*3/uL (ref 150–440)
RBC: 3.91 MIL/uL — ABNORMAL LOW (ref 4.40–5.90)
RDW: 13 % (ref 11.5–14.5)
WBC: 10 10*3/uL (ref 3.8–10.6)

## 2017-07-26 LAB — TROPONIN I
Troponin I: 0.16 ng/mL (ref <0.03)
Troponin I: 0.15 ng/mL (ref <0.03)

## 2017-07-26 MED ORDER — IPRATROPIUM-ALBUTEROL 0.5-2.5 (3) MG/3ML IN SOLN
3.0000 mL | Freq: Three times a day (TID) | RESPIRATORY_TRACT | Status: DC
  Administered 2017-07-26 – 2017-07-28 (×7): 3 mL via RESPIRATORY_TRACT
  Filled 2017-07-26 (×7): qty 3

## 2017-07-26 MED ORDER — ATORVASTATIN CALCIUM 20 MG PO TABS
40.0000 mg | ORAL_TABLET | Freq: Every day | ORAL | Status: DC
  Administered 2017-07-26 – 2017-07-27 (×2): 40 mg via ORAL
  Filled 2017-07-26 (×2): qty 2

## 2017-07-26 NOTE — Progress Notes (Signed)
*  PRELIMINARY RESULTS* Echocardiogram 2D Echocardiogram has been performed.  Anthony Johns 07/26/2017, 11:35 AM

## 2017-07-26 NOTE — Progress Notes (Signed)
[facility] NAME: Anthony Johns    MR#:  [id number]  DATE OF BIRTH:  [birth year]  SUBJECTIVE:  CHIEF COMPLAINT:   Chief Complaint  Patient presents with  . Shortness of Breath   Feeling better, no complaints REVIEW OF SYSTEMS:  CONSTITUTIONAL: No fever, fatigue or weakness.  EYES: No blurred or double vision.  EARS, NOSE, AND THROAT: No tinnitus or ear pain.  RESPIRATORY: No cough, shortness of breath, wheezing or hemoptysis.  CARDIOVASCULAR: No chest pain, orthopnea, edema.  GASTROINTESTINAL: No nausea, vomiting, diarrhea or abdominal pain.  GENITOURINARY: No dysuria, hematuria.  ENDOCRINE: No polyuria, nocturia,  HEMATOLOGY: No anemia, easy bruising or bleeding SKIN: No rash or lesion. MUSCULOSKELETAL: No joint pain or arthritis.   NEUROLOGIC: No tingling, numbness, weakness.  PSYCHIATRY: No anxiety or depression.   ROS  DRUG ALLERGIES:   Allergies  Allergen Reactions  . Tape     VITALS:  Blood pressure 131/69, pulse 79, temperature 97.9 F (36.6 C), temperature source Oral, resp. rate 19, height 5\' 6"  (1.676 m), weight 81.6 kg (180 lb), SpO2 90 %.  PHYSICAL EXAMINATION:  GENERAL:  84 y.o.-year-old patient lying in the bed with no acute distress.  EYES: Pupils equal, round, reactive to light and accommodation. No scleral icterus. Extraocular muscles intact.  HEENT: Head atraumatic, normocephalic. Oropharynx and nasopharynx clear.  NECK:  Supple, no jugular venous distention. No thyroid enlargement, no tenderness.  LUNGS: Normal breath sounds bilaterally, no wheezing, rales,rhonchi or crepitation. No use of accessory muscles of respiration.  CARDIOVASCULAR: S1, S2 normal. No murmurs, rubs, or gallops.  ABDOMEN: Soft, nontender, nondistended. Bowel sounds present. No organomegaly or mass.  EXTREMITIES: No pedal edema, cyanosis, or clubbing.  NEUROLOGIC: Cranial nerves II through XII are intact. Muscle strength 5/5 in  all extremities. Sensation intact. Gait not checked.  PSYCHIATRIC: The patient is alert and oriented x 3.  SKIN: No obvious rash, lesion, or ulcer.   Physical Exam LABORATORY PANEL:   CBC Recent Labs  Lab 07/26/17 0434  WBC 10.0  HGB 11.1*  HCT 33.1*  PLT 152   ------------------------------------------------------------------------------------------------------------------  Chemistries  Recent Labs  Lab 07/25/17 1539  NA 132*  K 3.8  CL 98*  CO2 24  GLUCOSE 154*  BUN 19  CREATININE 1.34*  CALCIUM 8.8*  AST 37  ALT 22  ALKPHOS 96  BILITOT 1.5*   ------------------------------------------------------------------------------------------------------------------  Cardiac Enzymes Recent Labs  Lab 07/25/17 2317 07/26/17 0434  TROPONINI 0.16* 0.15*   ------------------------------------------------------------------------------------------------------------------  RADIOLOGY:  Dg Chest 2 View  Result Date: 07/25/2017 CLINICAL DATA:  Shortness of breath for the past 2-3 weeks. Episode of pneumonia 2 months ago. Former smoker. EXAM: CHEST - 2 VIEW COMPARISON:  Chest x-ray of July 24, 2015 and CT scan chest of May 25, 2017. FINDINGS: The lungs are adequately inflated. There is an infiltrate in the left lower lobe posteriorly. The interstitial markings elsewhere are more conspicuous today. The heart is normal in size. The pulmonary vascularity is not engorged. There is calcification in the wall of the aortic arch. The bony thorax exhibits no acute abnormality. IMPRESSION: Left lower lobe pneumonia. Underlying chronic bronchitic-smoking related changes. No evidence of pulmonary edema. Followup PA and lateral chest X-ray is recommended in 3-4 weeks following trial of antibiotic therapy to ensure resolution and exclude underlying malignancy. Thoracic aortic atherosclerosis. Electronically Signed   By: David  Martinique M.D.   On: 07/25/2017 15:34    ASSESSMENT AND PLAN:  *Acute  hypoxic respiratory failure Resolving Secondary to pneumonia and acute probable COPD exacerbation Wean O2  *Acute left CAP Resolving Continue empiric Rocephin/azithromycin, follow-up on cultures  *Probable acute on chronic COPD exacerbation Resolving Continue IV Solu-Medrol with tapering as tolerated, mucolytic agents, aggressive pulmonary toilet with bronchodilator therapy, inhaled corticosteroids twice daily  *Acute elevated troponins w/ CAD Most likely secondary to demand ischemia due to above Ces neg. For ACS Continue aspirin/Plavix, Coreg, losartan, nitrates as needed, statin therapy, PRN nitrates, IV morphine PRN breakthrough pain, cardiology input appreciated,f/u on echocardiogram  *Chronic benign essential hypertension Stable Continue current regiment   All the records are reviewed and case discussed with Care Management/Social Workerr. Management plans discussed with the patient, family and they are in agreement.  CODE STATUS: dnr  TOTAL TIME TAKING CARE OF THIS PATIENT: 35 minutes.     POSSIBLE D/C IN 1-3 DAYS, DEPENDING ON CLINICAL CONDITION.   Avel Peace Haleigh Desmith M.D on 07/26/2017   Between 7am to 6pm - Pager - [phone]  After 6pm go to [email or web address] - password [email or web address] [facility]  Office  [phone]  CC: Primary care physician; Leonel Ramsay, MD  Note: This dictation was prepared with Dragon dictation along with smaller phrase technology. Any transcriptional errors that result from this process are unintentional.

## 2017-07-26 NOTE — Consult Note (Signed)
Cardiology Consultation:   Patient ID: Anthony Johns; [id number]; 07-04-1927   Admit date: 07/25/2017 Date of Consult: 07/26/2017  Primary Care Provider: Leonel Ramsay, MD Primary Cardiologist: Fletcher Anon   Patient Profile:   Anthony Johns is a 84 y.o. male with a hx of CAD status post prior MI with multiple PCI's including BMS of the RCA in 2010, known CTO of the LCx, HTN, HLD, and hiatal hernia who is being seen today for the evaluation of elevated troponin at the request of Dr. Jerelyn Charles.  History of Present Illness:   Anthony Johns underwent stress testing in 07/2016 that revealed mid inferior/inferolateral infarct without ischemia.  He was most recently seen in the office on 06/28/2017 and noted that back and February, 2019, he began to experience nasal and chest congestion with associated cough.  He was subsequently diagnosed with pneumonia and treated successfully with antibiotics.  Follow-up chest x-ray showed persistent opacities and a CT scan was performed in 05/2017 which showed scattered bilateral pulmonary nodules.  At his office visit on 5/8 he reported slow improvement from his pneumonia though did note continued dyspnea on exertion and more pronounced bilateral lower extremity swelling.  Weight was noted to be 183 pounds which was up from 182 pounds in 10/2016.  He was felt to be mildly volume overloaded on exam, even in the setting of his weight only being up approximately 1 pound from prior visit.  He was started on Lasix 20 mg daily.  Follow-up BMP on 07/05/2017 showed stable serum creatinine at 1.18 and potassium 5.0.  Echo on 07/13/2017 showed an EF of 60 to 65%, no regional wall motion abnormalities, grade 2 diastolic dysfunction, mild MR, left atrium normal in size, RV systolic function normal, PASP normal.  He was maintained on Lasix 20 mg daily. He was seen in the office on 07/21/2017 in follow up and noted an improvement in his lower extremity swelling and SOB. He was continued  on Lasix 20 mg daily. There was felt to be some component of venous insufficiency regarding his lower extremity swelling. BMP checked at that time showed a stable renal function and potassium at goal.   Patient and his family noted a significant increase in his SOB from 6/1into 6/2. Patient was at a graduation party on 6/1 and at his baseline, no SOB and cutting jokes. When the family saw him on 6/2, there was significant increase in SOB and cough with associated weakness and intermittent chest/jaw pain for which he took a SL NTG. Because of this rapid decline they convinced him to go to the ED.   Upon the patient's arrival to [facility] they were found to have BP 116/56, HR 89 bpm, temp 102.5, oxygen saturation 80% on room air requiring supplemental oxygen via nasal cannula at 3 L in the ED to maintain a saturation of 95%, weight 180 pounds. EKG showed NSR, 92 bpm, nonspecific st/t changes, CXR showed left lower lobe PNA. Labs showed troponin 0.21-->0.18-->0.16-->0.15, WBC 10.2, HGB 11.0, Na 132, K+ 3.8, SCr 1.34, albumin 3.1, lactic acid 1.7, blood cultures with no growth x 2 at < 24 hours. He was started on azithromycin, Rocephin, nebs, and steroids for his PNA. He was continued on PTA medications. Cardiology was asked to evaluate his troponin. Breathing has improved since his admission. He is now on 2 L via nasal cannula. Continues to note wheezing and a dry, nonproductive cough. No chest pain or jaw pain.   Past Medical History:  Diagnosis Date  . (HFpEF) heart failure with preserved ejection fraction ([place])    a. 06/2017 Echo: EF 60-65%, no rwma, Gr2 DD, mild MR, nl LA size, nl RV fxn, PASP wnl.  . Coronary artery disease    a. He reports having myocardial infarction at the age of 15. He reports having multiple PCI's; b. 03/2008 Cath/PCI: LAD mild dzs, LCX 132m, RCA 85d (3.0x18 [facility] BMS); c. 07/2016 MV: EF 55-65%, mid inf/inflat infarct w/o ischemia.  . Essential hypertension   . Hiatal hernia   .  Hypercalcemia   . Hyperlipidemia   . Hypertension   . Ischemic heart disease   . MI (myocardial infarction) [facility])     Past Surgical History:  Procedure Laterality Date  . CARDIAC CATHETERIZATION     [facility]  . CORONARY ANGIOPLASTY WITH STENT PLACEMENT       Home Meds: Prior to Admission medications   Medication Sig Start Date End Date Taking? Authorizing Provider  albuterol (PROVENTIL HFA;VENTOLIN HFA) 108 (90 Base) MCG/ACT inhaler Inhale 2 puffs every 4-6 hours as needed for wheezing. 07/21/17  Yes Trigger Frasier, Areta Haber, PA-C  amLODipine (NORVASC) 10 MG tablet Take 10 mg by mouth daily.  04/21/14  Yes [provider]  aspirin EC 81 MG tablet Take 81 mg by mouth daily.    Yes [provider]  carvedilol (COREG) 6.25 MG tablet TAKE 1 TABLET TWICE A DAY 05/29/17  Yes Wellington Hampshire, MD  clopidogrel (PLAVIX) 75 MG tablet Take 75 mg by mouth daily.  03/10/14  Yes [provider]  furosemide (LASIX) 20 MG tablet Take 1 tablet (20 mg total) by mouth daily. 06/28/17 09/26/17 Yes Theora Gianotti, NP  losartan (COZAAR) 100 MG tablet Take 100 mg by mouth daily.  04/21/14  Yes [provider]  magnesium oxide (MAG-OX) 400 MG tablet Take 400 mg by mouth daily.    Yes [provider]  nitroGLYCERIN (NITROSTAT) 0.4 MG SL tablet Place 0.4 mg under the tongue every 5 (five) minutes as needed.  04/21/14  Yes [provider]  omeprazole (PRILOSEC) 20 MG capsule TAKE 1 CAPSULE DAILY 11/13/13  Yes [provider]  sertraline (ZOLOFT) 25 MG tablet Take 25 mg by mouth daily. 02/15/17  Yes [provider]  simvastatin (ZOCOR) 20 MG tablet Take 20 mg by mouth daily at 6 PM.  04/21/14  Yes [provider]    Inpatient Medications: Scheduled Meds: . amLODipine  10 mg Oral Daily  . aspirin EC  81 mg Oral Daily  . budesonide (PULMICORT) nebulizer solution  0.5 mg Nebulization BID  . carvedilol  6.25 mg Oral BID  . clopidogrel  75 mg Oral  Daily  . enoxaparin (LOVENOX) injection  40 mg Subcutaneous Q24H  . furosemide  20 mg Oral Daily  . guaiFENesin  600 mg Oral BID  . ipratropium-albuterol  3 mL Nebulization TID  . losartan  100 mg Oral Daily  . magnesium oxide  400 mg Oral Daily  . mouth rinse  15 mL Mouth Rinse BID  . methylPREDNISolone (SOLU-MEDROL) injection  60 mg Intravenous Q6H  . pantoprazole  40 mg Oral Daily  . sertraline  25 mg Oral Daily  . simvastatin  20 mg Oral q1800   Continuous Infusions: . azithromycin 500 mg (07/25/17 1608)  . azithromycin    . cefTRIAXone (ROCEPHIN)  IV    . cefTRIAXone (ROCEPHIN)  IV Stopped (07/25/17 1654)  . lactated ringers 50 mL/hr at 07/25/17 1801  PRN Meds: morphine injection, nitroGLYCERIN, nitroGLYCERIN  Allergies:   Allergies  Allergen Reactions  . Tape     Social History:   Social History   Socioeconomic History  . Marital status: Widowed    Spouse name: Not on file  . Number of children: Not on file  . Years of education: Not on file  . Highest education level: Not on file  Occupational History  . Not on file  Social Needs  . Financial resource strain: Not on file  . Food insecurity:    Worry: Not on file    Inability: Not on file  . Transportation needs:    Medical: Not on file    Non-medical: Not on file  Tobacco Use  . Smoking status: Former Smoker    Types: Cigarettes    Last attempt to quit: 03/25/1982    Years since quitting: 35.3  . Smokeless tobacco: Never Used  Substance and Sexual Activity  . Alcohol use: Yes    Comment: occas.   . Drug use: No  . Sexual activity: Not on file  Lifestyle  . Physical activity:    Days per week: Not on file    Minutes per session: Not on file  . Stress: Not on file  Relationships  . Social connections:    Talks on phone: Not on file    Gets together: Not on file    Attends religious service: Not on file    Active member of club or organization: Not on file    Attends meetings of clubs or  organizations: Not on file    Relationship status: Not on file  . Intimate partner violence:    Fear of current or ex partner: Not on file    Emotionally abused: Not on file    Physically abused: Not on file    Forced sexual activity: Not on file  Other Topics Concern  . Not on file  Social History Narrative  . Not on file     Family History:  Family History  Problem Relation Age of Onset  . Hypertension Father     ROS:  Review of Systems  Constitutional: Positive for malaise/fatigue. Negative for chills, diaphoresis, fever and weight loss.  HENT: Negative for congestion.   Eyes: Negative for discharge and redness.  Respiratory: Positive for cough, shortness of breath and wheezing. Negative for hemoptysis and sputum production.   Cardiovascular: Negative for chest pain, palpitations, orthopnea, claudication, leg swelling and PND.  Gastrointestinal: Negative for abdominal pain, blood in stool, heartburn, melena, nausea and vomiting.  Genitourinary: Negative for hematuria.  Musculoskeletal: Negative for falls and myalgias.  Skin: Negative for rash.  Neurological: Positive for weakness. Negative for dizziness, tingling, tremors, sensory change, speech change, focal weakness and loss of consciousness.  Endo/Heme/Allergies: Does not bruise/bleed easily.  Psychiatric/Behavioral: Negative for substance abuse. The patient is not nervous/anxious.   All other systems reviewed and are negative.     Physical Exam/Data:   Vitals:   07/26/17 0526 07/26/17 0735 07/26/17 0748 07/26/17 0820  BP:  127/71    Pulse: 76 76    Resp:  18    Temp:  97.9 F (36.6 C)    TempSrc:  Oral    SpO2: 92% 94% 92% 93%  Weight:      Height:        Intake/Output Summary (Last 24 hours) at 07/26/2017 1043 Last data filed at 07/26/2017 0500 Gross per 24 hour  Intake 905.83 ml  Output 700  ml  Net 205.83 ml   Filed Weights   07/25/17 1454  Weight: 180 lb (81.6 kg)   Body mass index is 29.05 kg/m.     Physical Exam: General: Well developed, well nourished, in no acute distress. Head: Normocephalic, atraumatic, sclera non-icteric, no xanthomas, nares without discharge.  Neck: Negative for carotid bruits. JVD not elevated. Lungs: Diminished breath sounds bilaterally with rhonchi along the left base. Breathing is unlabored on 2 L via nasal cannula. Heart: RRR with S1 S2. II/VI systolic murmur at the apex, no rubs, or gallops appreciated. Abdomen: Soft, non-tender, non-distended with normoactive bowel sounds. No hepatomegaly. No rebound/guarding. No obvious abdominal masses. Msk:  Strength and tone appear normal for age. Extremities: No clubbing or cyanosis. No edema. Distal pedal pulses are 2+ and equal bilaterally. Neuro: Alert and oriented X 3. No facial asymmetry. No focal deficit. Moves all extremities spontaneously. Psych:  Responds to questions appropriately with a normal affect.   EKG:  The EKG was personally reviewed and demonstrates:  NSR, 92 bpm, nonspecific st/t changes Telemetry:  Telemetry was personally reviewed and demonstrates: not on telemetry   Weights: Filed Weights   07/25/17 1454  Weight: 180 lb (81.6 kg)    Relevant CV Studies: TTE 06/2017: Study Conclusions  - Left ventricle: The cavity size was normal. Systolic function was   normal. The estimated ejection fraction was in the range of 60%   to 65%. Wall motion was normal; there were no regional wall   motion abnormalities. Features are consistent with a pseudonormal   left ventricular filling pattern, with concomitant abnormal   relaxation and increased filling pressure (grade 2 diastolic   dysfunction). - Mitral valve: There was mild regurgitation. - Left atrium: The atrium was normal in size. - Right ventricle: Systolic function was normal. - Pulmonary arteries: Systolic pressure was within the normal   range.  Laboratory Data:  Chemistry Recent Labs  Lab 07/21/17 1045 07/25/17 1539  NA 137  132*  K 4.9 3.8  CL 100 98*  CO2 24 24  GLUCOSE 108* 154*  BUN 16 19  CREATININE 1.21 1.34*  CALCIUM 9.9 8.8*  GFRNONAA 53* 46*  GFRAA 61 53*  ANIONGAP  --  10    Recent Labs  Lab 07/25/17 1539  PROT 7.4  ALBUMIN 3.1*  AST 37  ALT 22  ALKPHOS 96  BILITOT 1.5*   Hematology Recent Labs  Lab 07/25/17 1539 07/26/17 0434  WBC 10.2 10.0  RBC 3.89* 3.91*  HGB 11.0* 11.1*  HCT 32.9* 33.1*  MCV 84.6 84.5  MCH 28.2 28.3  MCHC 33.3 33.4  RDW 13.1 13.0  PLT 162 152   Cardiac Enzymes Recent Labs  Lab 07/25/17 1539 07/25/17 1916 07/25/17 2317 07/26/17 0434  TROPONINI 0.21* 0.18* 0.16* 0.15*   No results for input(s): TROPIPOC in the last 168 hours.  BNPNo results for input(s): BNP, PROBNP in the last 168 hours.  DDimer No results for input(s): DDIMER in the last 168 hours.  Radiology/Studies:  Dg Chest 2 View  Result Date: 07/25/2017 IMPRESSION: Left lower lobe pneumonia. Underlying chronic bronchitic-smoking related changes. No evidence of pulmonary edema. Followup PA and lateral chest X-ray is recommended in 3-4 weeks following trial of antibiotic therapy to ensure resolution and exclude underlying malignancy. Thoracic aortic atherosclerosis. Electronically Signed   By: David  Martinique M.D.   On: 07/25/2017 15:34    Assessment and Plan:   1. Demand ischemia: -In the setting of his acute respiratory distress with  hypoxia secondary to CAP and AECOPD as well as AKI -No indication for heparin gtt -ASA -TTE pending per IM -If echo is stable, no plans for inpatient ischemic evaluation in his acute illness -Plan for outpatient ischemic evaluation as long as he remains stable and echo is unchanged   2. Acute respiratory distress with hypoxia: -Secondary to CAP with possible AECOPD -Wean oxygen as able per IM -Supportive care -Recommend incentive spirometry  -Consider pulmonology consult   3. CAP/AECOPD: -ABX, nebs, steroids per IM  4. CAD: -As above -ASA, Plavix,  Coreg  5. Chronic diastolic CHF: -He does not appear grossly volume overloaded -Monitor for fluid rentention with steroids -Labs show he is on the dry side with an up trending BUN and SCr -Has already received Lasix 6/5, hold -Recent TTE 06/2017 as above -IM has ordered a repeat TTE, can cancel if not already done  6. Pulmonary nodule: -Scheduled for follow CT chest as an outpatient   7. HTN: -BP well controlled -Continue Coreg, amlodipine, and losartan  8. AKI: -Hold Lasix as above -If renal function continues to trend upwards may need to hold losartan   9. Venous insufficiency: -Stable -Consider changing amlodipine as an outpatient   10. HLD: -LDL 83 from 04/2017 -Change simvastatin to Lipitor 40 mg daily    For questions or updates, please contact [place] Please consult [email or web address] for contact info under Cardiology/STEMI.   Signed, Christell Faith, PA-C [place] Pager: [phone] 07/26/2017, 10:43 AM

## 2017-07-27 LAB — BASIC METABOLIC PANEL
Anion gap: 9 (ref 5–15)
BUN: 36 mg/dL — ABNORMAL HIGH (ref 6–20)
CO2: 23 mmol/L (ref 22–32)
CREATININE: 1.08 mg/dL (ref 0.61–1.24)
Calcium: 8.9 mg/dL (ref 8.9–10.3)
Chloride: 103 mmol/L (ref 101–111)
GFR calc non Af Amer: 59 mL/min — ABNORMAL LOW (ref >60)
Glucose, Bld: 170 mg/dL — ABNORMAL HIGH (ref 65–99)
Potassium: 4.1 mmol/L (ref 3.5–5.1)
SODIUM: 135 mmol/L (ref 135–145)

## 2017-07-27 LAB — HIV ANTIBODY (ROUTINE TESTING W REFLEX): HIV SCREEN 4TH GENERATION: NONREACTIVE

## 2017-07-27 NOTE — Progress Notes (Addendum)
SATURATION QUALIFICATIONS: (This note is used to comply with regulatory documentation for home oxygen)  Patient Saturations on Room Air at Rest = 88 %  Patient Saturations on Room Air while Ambulating = 78%  Patient Saturations on _3__ Liters of oxygen while Ambulating = 90% Please briefly explain why patient needs home oxygen:

## 2017-07-27 NOTE — Progress Notes (Signed)
[facility] [place] NAME: Anthony Johns    MR#:  [id number]  DATE OF BIRTH:  [birth year]  SUBJECTIVE:  CHIEF COMPLAINT:   Chief Complaint  Patient presents with  . Shortness of Breath   Feeling better, shortness of breath is improving REVIEW OF SYSTEMS:  CONSTITUTIONAL: No fever, fatigue or weakness.  EYES: No blurred or double vision.  EARS, NOSE, AND THROAT: No tinnitus or ear pain.  RESPIRATORY: No cough, shortness of breath, wheezing or hemoptysis.  CARDIOVASCULAR: No chest pain, orthopnea, edema.  GASTROINTESTINAL: No nausea, vomiting, diarrhea or abdominal pain.  GENITOURINARY: No dysuria, hematuria.  ENDOCRINE: No polyuria, nocturia,  HEMATOLOGY: No anemia, easy bruising or bleeding SKIN: No rash or lesion. MUSCULOSKELETAL: No joint pain or arthritis.   NEUROLOGIC: No tingling, numbness, weakness.  PSYCHIATRY: No anxiety or depression.   ROS  DRUG ALLERGIES:   Allergies  Allergen Reactions  . Tape     VITALS:  Blood pressure 118/69, pulse 85, temperature 97.8 F (36.6 C), temperature source Oral, resp. rate 19, height 5\' 6"  (1.676 m), weight 81.6 kg (180 lb), SpO2 90 %.  PHYSICAL EXAMINATION:  GENERAL:  84 y.o.-year-old patient lying in the bed with no acute distress.  EYES: Pupils equal, round, reactive to light and accommodation. No scleral icterus. Extraocular muscles intact.  HEENT: Head atraumatic, normocephalic. Oropharynx and nasopharynx clear.  NECK:  Supple, no jugular venous distention. No thyroid enlargement, no tenderness.  LUNGS: Moderate breath sounds bilaterally, no wheezing, rales,rhonchi or crepitation. No use of accessory muscles of respiration.  CARDIOVASCULAR: S1, S2 normal. No murmurs, rubs, or gallops.  ABDOMEN: Soft, nontender, nondistended. Bowel sounds present. No organomegaly or mass.  EXTREMITIES: No pedal edema, cyanosis, or clubbing.  NEUROLOGIC: Cranial nerves II through XII are intact.  Muscle strength 5/5 in all extremities. Sensation intact. Gait not checked.  PSYCHIATRIC: The patient is alert and oriented x 3.  SKIN: No obvious rash, lesion, or ulcer.   Physical Exam LABORATORY PANEL:   CBC Recent Labs  Lab 07/26/17 0434  WBC 10.0  HGB 11.1*  HCT 33.1*  PLT 152   ------------------------------------------------------------------------------------------------------------------  Chemistries  Recent Labs  Lab 07/25/17 1539 07/27/17 0517  NA 132* 135  K 3.8 4.1  CL 98* 103  CO2 24 23  GLUCOSE 154* 170*  BUN 19 36*  CREATININE 1.34* 1.08  CALCIUM 8.8* 8.9  AST 37  --   ALT 22  --   ALKPHOS 96  --   BILITOT 1.5*  --    ------------------------------------------------------------------------------------------------------------------  Cardiac Enzymes Recent Labs  Lab 07/25/17 2317 07/26/17 0434  TROPONINI 0.16* 0.15*   ------------------------------------------------------------------------------------------------------------------  RADIOLOGY:  No results found.  ASSESSMENT AND PLAN:  *Acute hypoxic respiratory failure Resolving Secondary to pneumonia and acute probable COPD exacerbation Wean O2 as tolerated check  ambulatory pulse ox  *Acute left CAP Resolving Continue empiric Rocephin/azithromycin, follow-up on cultures  *Probable acute on chronic COPD exacerbation Resolving Continue IV Solu-Medrol with tapering  mucolytic agents, aggressive pulmonary toilet with bronchodilator therapy, inhaled corticosteroids twice daily Wean off oxygen as tolerated Intensive spirometry  *Acute elevated troponins w/ CAD Most likely secondary to demand ischemia due to above Ces neg. For ACS Continue aspirin/Plavix, Coreg, losartan, nitrates as needed, statin therapy, PRN nitrates, IV morphine PRN breakthrough pain, cardiology input appreciated,f/u on echocardiogram  *Chronic benign essential hypertension Stable Continue current  regiment   All the records are reviewed and case discussed with Care Management/Social Workerr. Management  plans discussed with the patient, family and they are in agreement.  CODE STATUS: dnr  TOTAL TIME TAKING CARE OF THIS PATIENT: 35 minutes.     POSSIBLE D/C IN 1- DAYS, DEPENDING ON CLINICAL CONDITION.   Nicholes Mango M.D on 07/27/2017   Between 7am to 6pm - Pager - [phone]  After 6pm go to [email or web address] - password [email or web address] [facility]  Office  [phone]  CC: Primary care physician; Leonel Ramsay, MD  Note: This dictation was prepared with Dragon dictation along with smaller phrase technology. Any transcriptional errors that result from this process are unintentional.

## 2017-07-28 MED ORDER — ATORVASTATIN CALCIUM 40 MG PO TABS: 40.0000 mg | ORAL_TABLET | Freq: Every day | ORAL | 0 refills | Status: DC

## 2017-07-28 MED ORDER — GUAIFENESIN-DM 100-10 MG/5ML PO SYRP: 5.0000 mL | ORAL_SOLUTION | Freq: Four times a day (QID) | ORAL | 0 refills | Status: DC | PRN

## 2017-07-28 MED ORDER — AMOXICILLIN-POT CLAVULANATE 875-125 MG PO TABS: 1.0000 | ORAL_TABLET | Freq: Two times a day (BID) | ORAL | 0 refills | Status: DC

## 2017-07-28 MED ORDER — AMOXICILLIN-POT CLAVULANATE 875-125 MG PO TABS: 1.0000 | ORAL_TABLET | Freq: Two times a day (BID) | ORAL | Status: DC

## 2017-07-28 MED ORDER — GUAIFENESIN-DM 100-10 MG/5ML PO SYRP: 5.0000 mL | ORAL_SOLUTION | Freq: Four times a day (QID) | ORAL | Status: DC | PRN

## 2017-07-28 MED ORDER — PREDNISONE 10 MG (21) PO TBPK: 10.0000 mg | ORAL_TABLET | Freq: Every day | ORAL | 0 refills | Status: DC

## 2017-07-28 MED ORDER — AZITHROMYCIN 250 MG PO TABS: ORAL_TABLET | ORAL | 0 refills | Status: DC

## 2017-07-28 NOTE — Discharge Summary (Addendum)
[facility] at [place] NAME: Anthony Johns    MR#:  [id number]  DATE OF BIRTH:  08-Jan-1928  DATE OF ADMISSION:  07/25/2017 ADMITTING PHYSICIAN: Gorden Harms, MD  DATE OF DISCHARGE: 07/28/17   PRIMARY CARE PHYSICIAN: Leonel Ramsay, MD    ADMISSION DIAGNOSIS:  Hypoxia [R09.02] Sepsis, due to unspecified organism ([place]) [A41.9] Community acquired pneumonia of left lower lobe of lung ([place]) [J18.1]  DISCHARGE DIAGNOSIS:  Active Problems:   CAP (community acquired pneumonia)  Acute hypoxic respiratory failure requiring 2 L of oxygen via nasal cannula  SECONDARY DIAGNOSIS:   Past Medical History:  Diagnosis Date  . (HFpEF) heart failure with preserved ejection fraction ([place])    a. 06/2017 Echo: EF 60-65%, no rwma, Gr2 DD, mild MR, nl LA size, nl RV fxn, PASP wnl.  . Coronary artery disease    a. He reports having myocardial infarction at the age of 29. He reports having multiple PCI's; b. 03/2008 Cath/PCI: LAD mild dzs, LCX 123m, RCA 85d (3.0x18 [facility] BMS); c. 07/2016 MV: EF 55-65%, mid inf/inflat infarct w/o ischemia.  . Essential hypertension   . Hiatal hernia   . Hypercalcemia   . Hyperlipidemia   . Hypertension   . Ischemic heart disease   . MI (myocardial infarction) [facility])     HOSPITAL COURSE:   HISTORY OF PRESENT ILLNESS: Anthony Johns  is a 84 y.o. male with a known history per below, not feeling well for 1 to 2 months, dyspnea on exertion, cough, generalized weakness, fatigue, chills, sweating, in the emergency room patient was noted to have fever 102.5, tachypnea, chest x-ray noted for left pneumonia, patient with remote smoking history but extensive-currently non-smoking, patient evaluated in the emergency room, granddaughter at the bedside, patient in mild distress, noted hypoxia with O2 saturation in the 80s on presentation, currently stable on 3 L via nasal cannula, patient is now been admitted for acute hypoxic  respiratory failure secondary to community-acquired left-sided pneumonia and probable acute on COPD exacerbation   *Acute hypoxic respiratory failure Resolving Secondary to pneumonia and acute probable COPD exacerbation Wean O2 as tolerated check  ambulatory pulse ox  *Acute left CAP Resolving Continue empiric Rocephin/azithromycin, follow-up on cultures  *Probable acute on chronic COPD exacerbation Resolving Taper  IV Solu-Medrol to p.o. prednisone mucolytic agents, aggressive pulmonary toilet with bronchodilator therapy, inhaled corticosteroids twice daily Intensive spirometry  *Acute elevated troponinsw/ CAD Most likely secondary to demand ischemia due to above Ces neg. For ACS Continue aspirin/Plavix, Coreg, losartan, nitrates as needed, statin therapy, PRN nitrates, IV morphine PRN breakthrough pain, cardiology input appreciated,f/u on echocardiogram-60 to 65% ejection fraction, cavity size was normal.  Wall motion was normal and no abnormalities  *Chronic benign essential hypertension Stable Continue current regiment   DISCHARGE CONDITIONS:   STABLE  CONSULTS OBTAINED:     PROCEDURES NONE  DRUG ALLERGIES:   Allergies  Allergen Reactions  . Tape     DISCHARGE MEDICATIONS:   Allergies as of 07/28/2017      Reactions   Tape       Medication List    TAKE these medications   albuterol 108 (90 Base) MCG/ACT inhaler Commonly known as:  PROVENTIL HFA;VENTOLIN HFA Inhale 2 puffs every 4-6 hours as needed for wheezing.   amLODipine 10 MG tablet Commonly known as:  NORVASC Take 10 mg by mouth daily.   amoxicillin-clavulanate 875-125 MG tablet Commonly known as:  AUGMENTIN Take 1 tablet by mouth  every 12 (twelve) hours.   aspirin EC 81 MG tablet Take 81 mg by mouth daily.   atorvastatin 40 MG tablet Commonly known as:  LIPITOR Take 1 tablet (40 mg total) by mouth daily at 6 PM.   azithromycin 250 MG tablet Commonly known as:  ZITHROMAX  Z-PAK 500 mg p.o. tomorrow on 07/29/2017 followed by 250 mg once daily for 4 days   carvedilol 6.25 MG tablet Commonly known as:  COREG TAKE 1 TABLET TWICE A DAY   clopidogrel 75 MG tablet Commonly known as:  PLAVIX Take 75 mg by mouth daily.   furosemide 20 MG tablet Commonly known as:  LASIX Take 1 tablet (20 mg total) by mouth daily.   guaiFENesin-dextromethorphan 100-10 MG/5ML syrup Commonly known as:  ROBITUSSIN DM Take 5 mLs by mouth every 6 (six) hours as needed for cough.   losartan 100 MG tablet Commonly known as:  COZAAR Take 100 mg by mouth daily.   magnesium oxide 400 MG tablet Commonly known as:  MAG-OX Take 400 mg by mouth daily.   nitroGLYCERIN 0.4 MG SL tablet Commonly known as:  NITROSTAT Place 0.4 mg under the tongue every 5 (five) minutes as needed.   omeprazole 20 MG capsule Commonly known as:  PRILOSEC TAKE 1 CAPSULE DAILY   predniSONE 10 MG (21) Tbpk tablet Commonly known as:  STERAPRED UNI-PAK 21 TAB Take 1 tablet (10 mg total) by mouth daily. Take 6 tablets by mouth for 1 day followed by  5 tablets by mouth for 1 day followed by  4 tablets by mouth for 1 day followed by  3 tablets by mouth for 1 day followed by  2 tablets by mouth for 1 day followed by  1 tablet by mouth for a day and stop   sertraline 25 MG tablet Commonly known as:  ZOLOFT Take 25 mg by mouth daily.   simvastatin 20 MG tablet Commonly known as:  ZOCOR Take 20 mg by mouth daily at 6 PM.            Durable Medical Equipment  (From admission, onward)        Start     Ordered   07/28/17 1129  For home use only DME oxygen  Once    Question Answer Comment  Mode or (Route) Nasal cannula   Liters per Minute 2   Frequency Continuous (stationary and portable oxygen unit needed)   Oxygen conserving device Yes   Oxygen delivery system Gas      07/28/17 1129       DISCHARGE INSTRUCTIONS:   Follow-up with primary care physician in 5 to 7 days Follow-up with  cardiology Dr. Fletcher Anon in 2 weeks Continue 2 L of home oxygen and home health  DIET:  Cardiac diet  DISCHARGE CONDITION:  Stable  ACTIVITY:  Activity as tolerated  OXYGEN:  Home Oxygen: Yes.     Oxygen Delivery: 2 liters/min via Patient connected to nasal cannula oxygen  DISCHARGE LOCATION:  home   If you experience worsening of your admission symptoms, develop shortness of breath, life threatening emergency, suicidal or homicidal thoughts you must seek medical attention immediately by calling 911 or calling your MD immediately  if symptoms less severe.  You Must read complete instructions/literature along with all the possible adverse reactions/side effects for all the Medicines you take and that have been prescribed to you. Take any new Medicines after you have completely understood and accpet all the possible adverse reactions/side effects.  Please note  You were cared for by a hospitalist during your hospital stay. If you have any questions about your discharge medications or the care you received while you were in the hospital after you are discharged, you can call the unit and asked to speak with the hospitalist on call if the hospitalist that took care of you is not available. Once you are discharged, your primary care physician will handle any further medical issues. Please note that NO REFILLS for any discharge medications will be authorized once you are discharged, as it is imperative that you return to your primary care physician (or establish a relationship with a primary care physician if you do not have one) for your aftercare needs so that they can reassess your need for medications and monitor your lab values.     Today  Chief Complaint  Patient presents with  . Shortness of Breath   Patient is feeling much better except for intermittent episodes of cough. Strongly feels that he should go home today  ROS:  CONSTITUTIONAL: Denies fevers, chills. Denies any  fatigue, weakness.  EYES: Denies blurry vision, double vision, eye pain. EARS, NOSE, THROAT: Denies tinnitus, ear pain, hearing loss. RESPIRATORY: Reports some cough, denies wheeze, shortness of breath.  CARDIOVASCULAR: Denies chest pain, palpitations, edema.  GASTROINTESTINAL: Denies nausea, vomiting, diarrhea, abdominal pain. Denies bright red blood per rectum. GENITOURINARY: Denies dysuria, hematuria. ENDOCRINE: Denies nocturia or thyroid problems. HEMATOLOGIC AND LYMPHATIC: Denies easy bruising or bleeding. SKIN: Denies rash or lesion. MUSCULOSKELETAL: Denies pain in neck, back, shoulder, knees, hips or arthritic symptoms.  NEUROLOGIC: Denies paralysis, paresthesias.  PSYCHIATRIC: Denies anxiety or depressive symptoms.   VITAL SIGNS:  Blood pressure (!) 157/74, pulse 78, temperature 97.6 F (36.4 C), temperature source Oral, resp. rate (!) 2, height 5\' 6"  (1.676 m), weight 81.6 kg (180 lb), SpO2 90 %.  I/O:    Intake/Output Summary (Last 24 hours) at 07/28/2017 1344 Last data filed at 07/28/2017 0500 Gross per 24 hour  Intake 238 ml  Output 1050 ml  Net -812 ml    PHYSICAL EXAMINATION:  GENERAL:  84 y.o.-year-old patient lying in the bed with no acute distress.  EYES: Pupils equal, round, reactive to light and accommodation. No scleral icterus. Extraocular muscles intact.  HEENT: Head atraumatic, normocephalic. Oropharynx and nasopharynx clear.  NECK:  Supple, no jugular venous distention. No thyroid enlargement, no tenderness.  LUNGS: Normal breath sounds bilaterally, no wheezing, rales,rhonchi or crepitation. No use of accessory muscles of respiration.  CARDIOVASCULAR: S1, S2 normal. No murmurs, rubs, or gallops.  ABDOMEN: Soft, non-tender, non-distended. Bowel sounds present. No organomegaly or mass.  EXTREMITIES: No pedal edema, cyanosis, or clubbing.  NEUROLOGIC: Cranial nerves II through XII are intact. Muscle strength 5/5 in all extremities. Sensation intact. Gait not  checked.  PSYCHIATRIC: The patient is alert and oriented x 3.  SKIN: No obvious rash, lesion, or ulcer.   DATA REVIEW:   CBC Recent Labs  Lab 07/26/17 0434  WBC 10.0  HGB 11.1*  HCT 33.1*  PLT 152    Chemistries  Recent Labs  Lab 07/25/17 1539 07/27/17 0517  NA 132* 135  K 3.8 4.1  CL 98* 103  CO2 24 23  GLUCOSE 154* 170*  BUN 19 36*  CREATININE 1.34* 1.08  CALCIUM 8.8* 8.9  AST 37  --   ALT 22  --   ALKPHOS 96  --   BILITOT 1.5*  --     Cardiac Enzymes Recent Labs  Lab 07/26/17 0434  TROPONINI 0.15*    Microbiology Results  Results for orders placed or performed during the hospital encounter of 07/25/17  Culture, blood (Routine x 2)     Status: None (Preliminary result)   Collection Time: 07/25/17  3:39 PM  Result Value Ref Range Status   Specimen Description BLOOD LEFT ANTECUBITAL  Final   Special Requests   Final    BOTTLES DRAWN AEROBIC AND ANAEROBIC Blood Culture results may not be optimal due to an excessive volume of blood received in culture bottles   Culture   Final    NO GROWTH 3 DAYS Performed at [facility], [address], [place], [place] [address]    Report Status PENDING  Incomplete  Culture, blood (Routine x 2)     Status: None (Preliminary result)   Collection Time: 07/25/17  3:39 PM  Result Value Ref Range Status   Specimen Description BLOOD RIGHT ANTECUBITAL  Final   Special Requests   Final    BOTTLES DRAWN AEROBIC AND ANAEROBIC Blood Culture results may not be optimal due to an excessive volume of blood received in culture bottles   Culture   Final    NO GROWTH 3 DAYS Performed at [facility], [address], [place],  [address]    Report Status PENDING  Incomplete    RADIOLOGY:  Dg Chest 2 View  Result Date: 07/25/2017 CLINICAL DATA:  Shortness of breath for the past 2-3 weeks. Episode of pneumonia 2 months ago. Former smoker. EXAM: CHEST - 2 VIEW COMPARISON:  Chest x-ray of July 24, 2015 and CT  scan chest of May 25, 2017. FINDINGS: The lungs are adequately inflated. There is an infiltrate in the left lower lobe posteriorly. The interstitial markings elsewhere are more conspicuous today. The heart is normal in size. The pulmonary vascularity is not engorged. There is calcification in the wall of the aortic arch. The bony thorax exhibits no acute abnormality. IMPRESSION: Left lower lobe pneumonia. Underlying chronic bronchitic-smoking related changes. No evidence of pulmonary edema. Followup PA and lateral chest X-ray is recommended in 3-4 weeks following trial of antibiotic therapy to ensure resolution and exclude underlying malignancy. Thoracic aortic atherosclerosis. Electronically Signed   By: David  Martinique M.D.   On: 07/25/2017 15:34    EKG:   Orders placed or performed during the hospital encounter of 07/25/17  . ED EKG  . ED EKG  . EKG 12-Lead  . EKG 12-Lead  . EKG      Management plans discussed with the patient, family and they are in agreement.  CODE STATUS:     Code Status Orders  (From admission, onward)        Start     Ordered   07/25/17 1724  Do not attempt resuscitation (DNR)  Continuous    Question Answer Comment  In the event of cardiac or respiratory ARREST Do not call a "code blue"   In the event of cardiac or respiratory ARREST Do not perform Intubation, CPR, defibrillation or ACLS   In the event of cardiac or respiratory ARREST Use medication by any route, position, wound care, and other measures to relive pain and suffering. May use oxygen, suction and manual treatment of airway obstruction as needed for comfort.   Comments Nurse may pronounce      07/25/17 1724    Code Status History    This patient has a current code status but no historical code status.    Advance  Directive Documentation     Most Recent Value  Type of Advance Directive  Healthcare Power of Attorney, Living will  Pre-existing out of facility DNR order (yellow form or pink  MOST form)  -  "MOST" Form in Place?  -      TOTAL TIME TAKING CARE OF THIS PATIENT: 41  minutes.   Note: This dictation was prepared with Dragon dictation along with smaller phrase technology. Any transcriptional errors that result from this process are unintentional.   @[facility] @  on 07/28/2017 at 1:44 PM  Between 7am to 6pm - Pager - [phone]  After 6pm go to [email or web address] - password [email or web address] [facility]  [facility]  Office  [phone]  CC: Primary care physician; Leonel Ramsay, MD

## 2017-07-28 NOTE — Care Management Important Message (Signed)
Copy of signed IM left with patient in room.  

## 2017-07-28 NOTE — Progress Notes (Signed)
07/28/2017 3:44 PM  Jobe Igo to be D/C'd Home per MD order.  Discussed prescriptions and follow up appointments with the patient. Prescriptions given to patient, medication list explained in detail. Pt verbalized understanding.  Allergies as of 07/28/2017      Reactions   Tape       Medication List    TAKE these medications   albuterol 108 (90 Base) MCG/ACT inhaler Commonly known as:  PROVENTIL HFA;VENTOLIN HFA Inhale 2 puffs every 4-6 hours as needed for wheezing.   amLODipine 10 MG tablet Commonly known as:  NORVASC Take 10 mg by mouth daily.   amoxicillin-clavulanate 875-125 MG tablet Commonly known as:  AUGMENTIN Take 1 tablet by mouth every 12 (twelve) hours.   aspirin EC 81 MG tablet Take 81 mg by mouth daily.   atorvastatin 40 MG tablet Commonly known as:  LIPITOR Take 1 tablet (40 mg total) by mouth daily at 6 PM.   azithromycin 250 MG tablet Commonly known as:  ZITHROMAX Z-PAK 500 mg p.o. tomorrow on 07/29/2017 followed by 250 mg once daily for 4 days   carvedilol 6.25 MG tablet Commonly known as:  COREG TAKE 1 TABLET TWICE A DAY   clopidogrel 75 MG tablet Commonly known as:  PLAVIX Take 75 mg by mouth daily.   furosemide 20 MG tablet Commonly known as:  LASIX Take 1 tablet (20 mg total) by mouth daily.   guaiFENesin-dextromethorphan 100-10 MG/5ML syrup Commonly known as:  ROBITUSSIN DM Take 5 mLs by mouth every 6 (six) hours as needed for cough.   losartan 100 MG tablet Commonly known as:  COZAAR Take 100 mg by mouth daily.   magnesium oxide 400 MG tablet Commonly known as:  MAG-OX Take 400 mg by mouth daily.   nitroGLYCERIN 0.4 MG SL tablet Commonly known as:  NITROSTAT Place 0.4 mg under the tongue every 5 (five) minutes as needed.   omeprazole 20 MG capsule Commonly known as:  PRILOSEC TAKE 1 CAPSULE DAILY   predniSONE 10 MG (21) Tbpk tablet Commonly known as:  STERAPRED UNI-PAK 21 TAB Take 1 tablet (10 mg total) by mouth daily. Take  6 tablets by mouth for 1 day followed by  5 tablets by mouth for 1 day followed by  4 tablets by mouth for 1 day followed by  3 tablets by mouth for 1 day followed by  2 tablets by mouth for 1 day followed by  1 tablet by mouth for a day and stop   sertraline 25 MG tablet Commonly known as:  ZOLOFT Take 25 mg by mouth daily.   simvastatin 20 MG tablet Commonly known as:  ZOCOR Take 20 mg by mouth daily at 6 PM.            Durable Medical Equipment  (From admission, onward)        Start     Ordered   07/28/17 1129  For home use only DME oxygen  Once    Question Answer Comment  Mode or (Route) Nasal cannula   Liters per Minute 2   Frequency Continuous (stationary and portable oxygen unit needed)   Oxygen conserving device Yes   Oxygen delivery system Gas      07/28/17 1129      Vitals:   07/28/17 0427 07/28/17 0717  BP: (!) 157/74   Pulse: 78   Resp: (!) 2   Temp: 97.6 F (36.4 C)   SpO2: 90% 90%    Skin clean, dry and  intact without evidence of skin break down, no evidence of skin tears noted. IV catheter discontinued intact. Site without signs and symptoms of complications. Dressing and pressure applied. Pt denies pain at this time. No complaints noted.  An After Visit Summary was printed and given to the patient. Patient escorted via [place], and D/C home via private auto.  Dola Argyle

## 2017-07-28 NOTE — Care Management Note (Signed)
Case Management Note  Patient Details  Name: Anthony Johns MRN: [id number] Date of Birth: 1927-04-04   Patient admitted from home with acute respiratory failure.  Patient lives at home alone.  Family lives locally for support.  PCP Ola Spurr.  Will be transitioning to Dr. Meade Maw.  Patient states that at baseline he drives himself.  Patient has qualifying o2 sats for home O2.  Patient agreeable to home health services, and does not have a preference of home health agency.  Corene Cornea with [facility] notified of referral.  Portable O2 to be delivered to room prior to discharge.  RNCM signing off.    Subjective/Objective:                    Action/Plan:   Expected Discharge Date:  07/28/17               Expected Discharge Plan:  [place]  In-House Referral:     Discharge planning Services  CM Consult  Post Acute Care Choice:  Home Health, Durable Medical Equipment Choice offered to:     DME Arranged:  Oxygen DME Agency:  [facility] [place] Arranged:  RN [facility] Agency:  [facility]  Status of Service:  Completed, signed off  If discussed at [place] of Stay Meetings, dates discussed:    Additional Comments:  Beverly Sessions, RN 07/28/2017, 12:26 PM

## 2017-07-28 NOTE — Discharge Instructions (Signed)
Follow-up with primary care physician in 5 to 7 days Follow-up with cardiology Dr. Fletcher Anon in 2 weeks Continue 2 L of home oxygen and home health

## 2017-07-29 DIAGNOSIS — Z7951 Long term (current) use of inhaled steroids: Secondary | ICD-10-CM | POA: Diagnosis not present

## 2017-07-29 DIAGNOSIS — Z7952 Long term (current) use of systemic steroids: Secondary | ICD-10-CM | POA: Diagnosis not present

## 2017-07-29 DIAGNOSIS — E785 Hyperlipidemia, unspecified: Secondary | ICD-10-CM | POA: Diagnosis not present

## 2017-07-29 DIAGNOSIS — J441 Chronic obstructive pulmonary disease with (acute) exacerbation: Secondary | ICD-10-CM | POA: Diagnosis not present

## 2017-07-29 DIAGNOSIS — I872 Venous insufficiency (chronic) (peripheral): Secondary | ICD-10-CM | POA: Diagnosis not present

## 2017-07-29 DIAGNOSIS — I252 Old myocardial infarction: Secondary | ICD-10-CM | POA: Diagnosis not present

## 2017-07-29 DIAGNOSIS — J9601 Acute respiratory failure with hypoxia: Secondary | ICD-10-CM | POA: Diagnosis not present

## 2017-07-29 DIAGNOSIS — J440 Chronic obstructive pulmonary disease with acute lower respiratory infection: Secondary | ICD-10-CM | POA: Diagnosis not present

## 2017-07-29 DIAGNOSIS — J181 Lobar pneumonia, unspecified organism: Secondary | ICD-10-CM | POA: Diagnosis not present

## 2017-07-29 DIAGNOSIS — I248 Other forms of acute ischemic heart disease: Secondary | ICD-10-CM | POA: Diagnosis not present

## 2017-07-29 DIAGNOSIS — I5032 Chronic diastolic (congestive) heart failure: Secondary | ICD-10-CM | POA: Diagnosis not present

## 2017-07-29 DIAGNOSIS — Z7982 Long term (current) use of aspirin: Secondary | ICD-10-CM | POA: Diagnosis not present

## 2017-07-29 DIAGNOSIS — Z87891 Personal history of nicotine dependence: Secondary | ICD-10-CM | POA: Diagnosis not present

## 2017-07-29 DIAGNOSIS — Z9981 Dependence on supplemental oxygen: Secondary | ICD-10-CM | POA: Diagnosis not present

## 2017-07-29 DIAGNOSIS — I110 Hypertensive heart disease with heart failure: Secondary | ICD-10-CM | POA: Diagnosis not present

## 2017-07-29 DIAGNOSIS — R911 Solitary pulmonary nodule: Secondary | ICD-10-CM | POA: Diagnosis not present

## 2017-07-29 DIAGNOSIS — I2510 Atherosclerotic heart disease of native coronary artery without angina pectoris: Secondary | ICD-10-CM | POA: Diagnosis not present

## 2017-07-30 LAB — CULTURE, BLOOD (ROUTINE X 2)
CULTURE: NO GROWTH
Culture: NO GROWTH

## 2017-07-31 DIAGNOSIS — R918 Other nonspecific abnormal finding of lung field: Secondary | ICD-10-CM | POA: Diagnosis not present

## 2017-07-31 DIAGNOSIS — I259 Chronic ischemic heart disease, unspecified: Secondary | ICD-10-CM | POA: Diagnosis not present

## 2017-07-31 DIAGNOSIS — J189 Pneumonia, unspecified organism: Secondary | ICD-10-CM | POA: Diagnosis not present

## 2017-07-31 DIAGNOSIS — J9611 Chronic respiratory failure with hypoxia: Secondary | ICD-10-CM | POA: Diagnosis not present

## 2017-07-31 DIAGNOSIS — I10 Essential (primary) hypertension: Secondary | ICD-10-CM | POA: Diagnosis not present

## 2017-08-01 ENCOUNTER — Telehealth: Payer: Self-pay

## 2017-08-01 DIAGNOSIS — I2510 Atherosclerotic heart disease of native coronary artery without angina pectoris: Secondary | ICD-10-CM | POA: Diagnosis not present

## 2017-08-01 DIAGNOSIS — J181 Lobar pneumonia, unspecified organism: Secondary | ICD-10-CM | POA: Diagnosis not present

## 2017-08-01 DIAGNOSIS — J441 Chronic obstructive pulmonary disease with (acute) exacerbation: Secondary | ICD-10-CM | POA: Diagnosis not present

## 2017-08-01 DIAGNOSIS — J440 Chronic obstructive pulmonary disease with acute lower respiratory infection: Secondary | ICD-10-CM | POA: Diagnosis not present

## 2017-08-01 DIAGNOSIS — J9601 Acute respiratory failure with hypoxia: Secondary | ICD-10-CM | POA: Diagnosis not present

## 2017-08-01 DIAGNOSIS — I110 Hypertensive heart disease with heart failure: Secondary | ICD-10-CM | POA: Diagnosis not present

## 2017-08-01 NOTE — Telephone Encounter (Signed)
EMMI Follow-up: Noted on the report the patient had not read his discharge paperwork yet.  I talked with Anthony Johns and he had reviewed the paperwork and saw MD office number on there and plans to call and to move his 10/24/17 appointment forward. He said he was glad we started giving out this paperwork and has found it helpful to have the phone numbers readily available. He said it appreciated the follow-up calls too. Felt like things were going well. No needs noted. Let him know there would be a second automated call and to let us know if he had any needs at that time.

## 2017-08-03 DIAGNOSIS — J441 Chronic obstructive pulmonary disease with (acute) exacerbation: Secondary | ICD-10-CM | POA: Diagnosis not present

## 2017-08-03 DIAGNOSIS — J9601 Acute respiratory failure with hypoxia: Secondary | ICD-10-CM | POA: Diagnosis not present

## 2017-08-03 DIAGNOSIS — J440 Chronic obstructive pulmonary disease with acute lower respiratory infection: Secondary | ICD-10-CM | POA: Diagnosis not present

## 2017-08-03 DIAGNOSIS — J181 Lobar pneumonia, unspecified organism: Secondary | ICD-10-CM | POA: Diagnosis not present

## 2017-08-03 DIAGNOSIS — I110 Hypertensive heart disease with heart failure: Secondary | ICD-10-CM | POA: Diagnosis not present

## 2017-08-03 DIAGNOSIS — I2510 Atherosclerotic heart disease of native coronary artery without angina pectoris: Secondary | ICD-10-CM | POA: Diagnosis not present

## 2017-08-07 ENCOUNTER — Telehealth: Payer: Self-pay | Admitting: Licensed Clinical Social Worker

## 2017-08-07 NOTE — Telephone Encounter (Signed)
CSW contacted patient in reference to a response he gave on an EMMI callback. Patient had selected he had lost interest in things. CSW spoke with patient this morning. He was very pleasant and cooperative with CSW. He stated that he has not been having any needs and has no concerns regarding his mood or otherwise. CSW advised to call the hospital if he should have any concerns.  Shela Leff MSW,LCSW [phone]

## 2017-08-09 DIAGNOSIS — I2510 Atherosclerotic heart disease of native coronary artery without angina pectoris: Secondary | ICD-10-CM | POA: Diagnosis not present

## 2017-08-09 DIAGNOSIS — J181 Lobar pneumonia, unspecified organism: Secondary | ICD-10-CM | POA: Diagnosis not present

## 2017-08-09 DIAGNOSIS — J441 Chronic obstructive pulmonary disease with (acute) exacerbation: Secondary | ICD-10-CM | POA: Diagnosis not present

## 2017-08-09 DIAGNOSIS — J9601 Acute respiratory failure with hypoxia: Secondary | ICD-10-CM | POA: Diagnosis not present

## 2017-08-09 DIAGNOSIS — I110 Hypertensive heart disease with heart failure: Secondary | ICD-10-CM | POA: Diagnosis not present

## 2017-08-09 DIAGNOSIS — J440 Chronic obstructive pulmonary disease with acute lower respiratory infection: Secondary | ICD-10-CM | POA: Diagnosis not present

## 2017-08-11 DIAGNOSIS — J9601 Acute respiratory failure with hypoxia: Secondary | ICD-10-CM | POA: Diagnosis not present

## 2017-08-11 DIAGNOSIS — I110 Hypertensive heart disease with heart failure: Secondary | ICD-10-CM | POA: Diagnosis not present

## 2017-08-11 DIAGNOSIS — I2510 Atherosclerotic heart disease of native coronary artery without angina pectoris: Secondary | ICD-10-CM | POA: Diagnosis not present

## 2017-08-11 DIAGNOSIS — J181 Lobar pneumonia, unspecified organism: Secondary | ICD-10-CM | POA: Diagnosis not present

## 2017-08-11 DIAGNOSIS — J441 Chronic obstructive pulmonary disease with (acute) exacerbation: Secondary | ICD-10-CM | POA: Diagnosis not present

## 2017-08-11 DIAGNOSIS — J440 Chronic obstructive pulmonary disease with acute lower respiratory infection: Secondary | ICD-10-CM | POA: Diagnosis not present

## 2017-08-14 DIAGNOSIS — R918 Other nonspecific abnormal finding of lung field: Secondary | ICD-10-CM | POA: Diagnosis not present

## 2017-08-14 DIAGNOSIS — R0602 Shortness of breath: Secondary | ICD-10-CM | POA: Diagnosis not present

## 2017-08-14 DIAGNOSIS — R9389 Abnormal findings on diagnostic imaging of other specified body structures: Secondary | ICD-10-CM | POA: Diagnosis not present

## 2017-08-14 DIAGNOSIS — Z9981 Dependence on supplemental oxygen: Secondary | ICD-10-CM | POA: Diagnosis not present

## 2017-08-15 DIAGNOSIS — J440 Chronic obstructive pulmonary disease with acute lower respiratory infection: Secondary | ICD-10-CM | POA: Diagnosis not present

## 2017-08-15 DIAGNOSIS — J9601 Acute respiratory failure with hypoxia: Secondary | ICD-10-CM | POA: Diagnosis not present

## 2017-08-15 DIAGNOSIS — J441 Chronic obstructive pulmonary disease with (acute) exacerbation: Secondary | ICD-10-CM | POA: Diagnosis not present

## 2017-08-15 DIAGNOSIS — I2510 Atherosclerotic heart disease of native coronary artery without angina pectoris: Secondary | ICD-10-CM | POA: Diagnosis not present

## 2017-08-15 DIAGNOSIS — J181 Lobar pneumonia, unspecified organism: Secondary | ICD-10-CM | POA: Diagnosis not present

## 2017-08-15 DIAGNOSIS — I110 Hypertensive heart disease with heart failure: Secondary | ICD-10-CM | POA: Diagnosis not present

## 2017-08-17 ENCOUNTER — Telehealth: Payer: Self-pay | Admitting: Cardiovascular Disease

## 2017-08-17 DIAGNOSIS — J181 Lobar pneumonia, unspecified organism: Secondary | ICD-10-CM | POA: Diagnosis not present

## 2017-08-17 DIAGNOSIS — J440 Chronic obstructive pulmonary disease with acute lower respiratory infection: Secondary | ICD-10-CM | POA: Diagnosis not present

## 2017-08-17 DIAGNOSIS — J9601 Acute respiratory failure with hypoxia: Secondary | ICD-10-CM | POA: Diagnosis not present

## 2017-08-17 DIAGNOSIS — R911 Solitary pulmonary nodule: Secondary | ICD-10-CM | POA: Diagnosis not present

## 2017-08-17 DIAGNOSIS — I5032 Chronic diastolic (congestive) heart failure: Secondary | ICD-10-CM | POA: Diagnosis not present

## 2017-08-17 DIAGNOSIS — I872 Venous insufficiency (chronic) (peripheral): Secondary | ICD-10-CM | POA: Diagnosis not present

## 2017-08-17 DIAGNOSIS — I252 Old myocardial infarction: Secondary | ICD-10-CM | POA: Diagnosis not present

## 2017-08-17 DIAGNOSIS — J441 Chronic obstructive pulmonary disease with (acute) exacerbation: Secondary | ICD-10-CM | POA: Diagnosis not present

## 2017-08-17 DIAGNOSIS — I110 Hypertensive heart disease with heart failure: Secondary | ICD-10-CM | POA: Diagnosis not present

## 2017-08-17 DIAGNOSIS — I2510 Atherosclerotic heart disease of native coronary artery without angina pectoris: Secondary | ICD-10-CM | POA: Diagnosis not present

## 2017-08-17 MED ORDER — FUROSEMIDE 20 MG PO TABS: 20.0000 mg | ORAL_TABLET | Freq: Every day | ORAL | 3 refills | Status: DC

## 2017-08-17 NOTE — Telephone Encounter (Signed)
Anthony Johns with [facility] calling needing to clarify which medication is patient to be on She is questioning Furosemide  They have him on 20 mg a day  But he stopped taking it for it had no refills left but would like to make sure if patient is to take this or not  If patient is to be on this please call [facility] in [place]

## 2017-08-17 NOTE — Telephone Encounter (Signed)
S/w Larena Glassman from [place]. Patient saw his bottle said "no refills" and thought that meant he was not supposed to take it anymore. She does not think it has been that long. New Rx went to pharmacy and she will notify patient and make sure he's taking it.

## 2017-08-17 NOTE — Telephone Encounter (Signed)
No answer. Left message to call back.  It does appear patient is still on furosemide.  Refill sent in. Patient has upcoming appointment with our office in July.

## 2017-08-22 DIAGNOSIS — J9601 Acute respiratory failure with hypoxia: Secondary | ICD-10-CM | POA: Diagnosis not present

## 2017-08-22 DIAGNOSIS — J181 Lobar pneumonia, unspecified organism: Secondary | ICD-10-CM | POA: Diagnosis not present

## 2017-08-22 DIAGNOSIS — J441 Chronic obstructive pulmonary disease with (acute) exacerbation: Secondary | ICD-10-CM | POA: Diagnosis not present

## 2017-08-22 DIAGNOSIS — I2510 Atherosclerotic heart disease of native coronary artery without angina pectoris: Secondary | ICD-10-CM | POA: Diagnosis not present

## 2017-08-22 DIAGNOSIS — J440 Chronic obstructive pulmonary disease with acute lower respiratory infection: Secondary | ICD-10-CM | POA: Diagnosis not present

## 2017-08-22 DIAGNOSIS — I110 Hypertensive heart disease with heart failure: Secondary | ICD-10-CM | POA: Diagnosis not present

## 2017-08-24 DIAGNOSIS — J9601 Acute respiratory failure with hypoxia: Secondary | ICD-10-CM | POA: Diagnosis not present

## 2017-08-24 DIAGNOSIS — J441 Chronic obstructive pulmonary disease with (acute) exacerbation: Secondary | ICD-10-CM | POA: Diagnosis not present

## 2017-08-24 DIAGNOSIS — I110 Hypertensive heart disease with heart failure: Secondary | ICD-10-CM | POA: Diagnosis not present

## 2017-08-24 DIAGNOSIS — I2510 Atherosclerotic heart disease of native coronary artery without angina pectoris: Secondary | ICD-10-CM | POA: Diagnosis not present

## 2017-08-24 DIAGNOSIS — J440 Chronic obstructive pulmonary disease with acute lower respiratory infection: Secondary | ICD-10-CM | POA: Diagnosis not present

## 2017-08-24 DIAGNOSIS — J181 Lobar pneumonia, unspecified organism: Secondary | ICD-10-CM | POA: Diagnosis not present

## 2017-08-27 ENCOUNTER — Other Ambulatory Visit: Payer: Self-pay | Admitting: Cardiovascular Disease

## 2017-08-28 DIAGNOSIS — R918 Other nonspecific abnormal finding of lung field: Secondary | ICD-10-CM | POA: Diagnosis not present

## 2017-08-28 DIAGNOSIS — J9611 Chronic respiratory failure with hypoxia: Secondary | ICD-10-CM | POA: Diagnosis not present

## 2017-08-28 DIAGNOSIS — R0602 Shortness of breath: Secondary | ICD-10-CM | POA: Diagnosis not present

## 2017-08-29 DIAGNOSIS — J9601 Acute respiratory failure with hypoxia: Secondary | ICD-10-CM | POA: Diagnosis not present

## 2017-08-29 DIAGNOSIS — J181 Lobar pneumonia, unspecified organism: Secondary | ICD-10-CM | POA: Diagnosis not present

## 2017-08-29 DIAGNOSIS — J441 Chronic obstructive pulmonary disease with (acute) exacerbation: Secondary | ICD-10-CM | POA: Diagnosis not present

## 2017-08-29 DIAGNOSIS — I110 Hypertensive heart disease with heart failure: Secondary | ICD-10-CM | POA: Diagnosis not present

## 2017-08-29 DIAGNOSIS — J440 Chronic obstructive pulmonary disease with acute lower respiratory infection: Secondary | ICD-10-CM | POA: Diagnosis not present

## 2017-08-29 DIAGNOSIS — I2510 Atherosclerotic heart disease of native coronary artery without angina pectoris: Secondary | ICD-10-CM | POA: Diagnosis not present

## 2017-08-31 DIAGNOSIS — J9601 Acute respiratory failure with hypoxia: Secondary | ICD-10-CM | POA: Diagnosis not present

## 2017-08-31 DIAGNOSIS — J181 Lobar pneumonia, unspecified organism: Secondary | ICD-10-CM | POA: Diagnosis not present

## 2017-08-31 DIAGNOSIS — I110 Hypertensive heart disease with heart failure: Secondary | ICD-10-CM | POA: Diagnosis not present

## 2017-08-31 DIAGNOSIS — J441 Chronic obstructive pulmonary disease with (acute) exacerbation: Secondary | ICD-10-CM | POA: Diagnosis not present

## 2017-08-31 DIAGNOSIS — I2510 Atherosclerotic heart disease of native coronary artery without angina pectoris: Secondary | ICD-10-CM | POA: Diagnosis not present

## 2017-08-31 DIAGNOSIS — J440 Chronic obstructive pulmonary disease with acute lower respiratory infection: Secondary | ICD-10-CM | POA: Diagnosis not present

## 2017-09-05 ENCOUNTER — Encounter: Payer: Self-pay | Admitting: Physician Assistant

## 2017-09-05 ENCOUNTER — Ambulatory Visit (INDEPENDENT_AMBULATORY_CARE_PROVIDER_SITE_OTHER): Payer: Medicare Other | Admitting: Physician Assistant

## 2017-09-05 VITALS — BP 128/60 | HR 69 | Ht 64.0 in | Wt 180.5 lb

## 2017-09-05 DIAGNOSIS — I2510 Atherosclerotic heart disease of native coronary artery without angina pectoris: Secondary | ICD-10-CM | POA: Diagnosis not present

## 2017-09-05 DIAGNOSIS — J449 Chronic obstructive pulmonary disease, unspecified: Secondary | ICD-10-CM | POA: Diagnosis not present

## 2017-09-05 DIAGNOSIS — I5032 Chronic diastolic (congestive) heart failure: Secondary | ICD-10-CM

## 2017-09-05 DIAGNOSIS — E782 Mixed hyperlipidemia: Secondary | ICD-10-CM | POA: Diagnosis not present

## 2017-09-05 DIAGNOSIS — I872 Venous insufficiency (chronic) (peripheral): Secondary | ICD-10-CM | POA: Diagnosis not present

## 2017-09-05 DIAGNOSIS — I10 Essential (primary) hypertension: Secondary | ICD-10-CM

## 2017-09-05 DIAGNOSIS — R911 Solitary pulmonary nodule: Secondary | ICD-10-CM | POA: Diagnosis not present

## 2017-09-05 DIAGNOSIS — I25118 Atherosclerotic heart disease of native coronary artery with other forms of angina pectoris: Secondary | ICD-10-CM | POA: Diagnosis not present

## 2017-09-05 DIAGNOSIS — J441 Chronic obstructive pulmonary disease with (acute) exacerbation: Secondary | ICD-10-CM | POA: Diagnosis not present

## 2017-09-05 DIAGNOSIS — R079 Chest pain, unspecified: Secondary | ICD-10-CM

## 2017-09-05 DIAGNOSIS — J9611 Chronic respiratory failure with hypoxia: Secondary | ICD-10-CM

## 2017-09-05 DIAGNOSIS — J440 Chronic obstructive pulmonary disease with acute lower respiratory infection: Secondary | ICD-10-CM | POA: Diagnosis not present

## 2017-09-05 DIAGNOSIS — R748 Abnormal levels of other serum enzymes: Secondary | ICD-10-CM

## 2017-09-05 DIAGNOSIS — J9601 Acute respiratory failure with hypoxia: Secondary | ICD-10-CM | POA: Diagnosis not present

## 2017-09-05 DIAGNOSIS — I110 Hypertensive heart disease with heart failure: Secondary | ICD-10-CM | POA: Diagnosis not present

## 2017-09-05 DIAGNOSIS — I6523 Occlusion and stenosis of bilateral carotid arteries: Secondary | ICD-10-CM | POA: Diagnosis not present

## 2017-09-05 DIAGNOSIS — R0602 Shortness of breath: Secondary | ICD-10-CM | POA: Diagnosis not present

## 2017-09-05 DIAGNOSIS — J181 Lobar pneumonia, unspecified organism: Secondary | ICD-10-CM | POA: Diagnosis not present

## 2017-09-05 DIAGNOSIS — R778 Other specified abnormalities of plasma proteins: Secondary | ICD-10-CM

## 2017-09-05 DIAGNOSIS — R7989 Other specified abnormal findings of blood chemistry: Secondary | ICD-10-CM

## 2017-09-05 NOTE — Progress Notes (Signed)
Cardiology Office Note Date:  09/05/2017  Patient ID:  Anthony Johns 1927-09-11, MRN [id number] PCP:  Leonel Ramsay, MD  Cardiologist:  Dr. Fletcher Anon, MD    Chief Complaint: Hospital follow up  History of Present Illness: Anthony Johns is a 84 y.o. male with history of CAD status post prior MI with multiple PCI's including BMS of the RCA in 2010, known CTO of the LCx, HTN, HLD, and hiatal hernia who presents for hospital follow up after recent admission to [facility] from 6/4-6/7 for PNA.   Patient most recently underwent stress testing in 07/2016 that revealed mid inferior/inferolateral infarct without ischemia.  He has had multiple PCIs including BMS to the RCA in 2010. He has a known CTO of the LCx. He was seen in the office on 06/28/2017 and noted that back and 03/2017, he began to experience nasal and chest congestion with associated cough.  He was subsequently diagnosed with pneumonia and treated successfully with antibiotics.  Follow-up chest x-ray showed persistent opacities and a CT scan was performed in 05/2017 which showed scattered bilateral pulmonary nodules.  At his office visit on 5/8 he reported slow improvement from his pneumonia though did note continued dyspnea on exertion and more pronounced bilateral lower extremity swelling.  Weight was noted to be 183 pounds which was up from 182 pounds in 10/2016.  He was felt to be mildly volume overloaded on exam, even in the setting of his weight only being up approximately 1 pound from prior visit.  He was started on Lasix 20 mg daily.  Follow-up BMP on 07/05/2017 showed stable serum creatinine at 1.18 and potassium 5.0.  Echo on 07/13/2017 showed an EF of 60 to 65%, no regional wall motion abnormalities, grade 2 diastolic dysfunction, mild MR, left atrium normal in size, RV systolic function normal, PASP normal.  He was maintained on Lasix 20 mg daily. In follow up on 5/31 he noted some improvement in his lower extremity swelling and SOB. He  was not having any chest pain. Unfortunately, on 6/2 he began to notice increased SOB and cough. He ultimately presented to [facility] on 6/4 and was found to have a left lower lobe PNA. He bumped his troponin to a peak of 0.21 (initial value) with subsequent values being down trending. Echo on 07/26/17 showed an EF of 60-65%, no RWMA, Gr2DD, mild MR, RVSF normal, PASP normal. His elevated troponin was felt to be supply demand ischemia in the setting of known CAD with an occluded LCx. His PNA was treated with ABX per IM.   He comes in today doing well. He does continue to note some exertional SOB, though this is improved from when he was in the hospital by a significant amount. He notes a left lower chest "discomfort" that is positional. His lower extremity swelling is about the same and he refuses to wear compression stockings. He will sometimes elevate his legs. He has followed up with pulmonology with plans for repeat CT chest to be done in 09/2017. He remains on supplemental oxygen. Weight is down 2 pounds from when he was last seen in 06/2017.   Past Medical History:  Diagnosis Date  . (HFpEF) heart failure with preserved ejection fraction ([place])    a. 06/2017 Echo: EF 60-65%, no rwma, Gr2 DD, mild MR, nl LA size, nl RV fxn, PASP wnl.  . Coronary artery disease    a. He reports having myocardial infarction at the age of 70. He reports having multiple  PCI's; b. 03/2008 Cath/PCI: LAD mild dzs, LCX 170m, RCA 85d (3.0x18 Vision BMS); c. 07/2016 MV: EF 55-65%, mid inf/inflat infarct w/o ischemia.  . Essential hypertension   . Hiatal hernia   . Hypercalcemia   . Hyperlipidemia   . Hypertension   . Ischemic heart disease   . MI (myocardial infarction) [facility])     Past Surgical History:  Procedure Laterality Date  . CARDIAC CATHETERIZATION     [facility]  . CORONARY ANGIOPLASTY WITH STENT PLACEMENT      Current Meds  Medication Sig  . albuterol (PROVENTIL HFA;VENTOLIN HFA) 108 (90 Base) MCG/ACT inhaler Inhale 2  puffs every 4-6 hours as needed for wheezing.  Marland Kitchen amLODipine (NORVASC) 10 MG tablet Take 10 mg by mouth daily.   Marland Kitchen aspirin EC 81 MG tablet Take 81 mg by mouth daily.   . carvedilol (COREG) 6.25 MG tablet TAKE 1 TABLET TWICE A DAY  . clopidogrel (PLAVIX) 75 MG tablet Take 75 mg by mouth daily.   . furosemide (LASIX) 20 MG tablet Take 1 tablet (20 mg total) by mouth daily.  Marland Kitchen guaiFENesin-dextromethorphan (ROBITUSSIN DM) 100-10 MG/5ML syrup Take 5 mLs by mouth every 6 (six) hours as needed for cough.  . losartan (COZAAR) 100 MG tablet Take 100 mg by mouth daily.   . magnesium oxide (MAG-OX) 400 MG tablet Take 400 mg by mouth daily.   . nitroGLYCERIN (NITROSTAT) 0.4 MG SL tablet Place 0.4 mg under the tongue every 5 (five) minutes as needed.   Marland Kitchen omeprazole (PRILOSEC) 20 MG capsule TAKE 1 CAPSULE DAILY  . OXYGEN Inhale 2 L into the lungs daily.  . sertraline (ZOLOFT) 25 MG tablet Take 25 mg by mouth daily.  . simvastatin (ZOCOR) 20 MG tablet Take 20 mg by mouth daily at 6 PM.     Allergies:   Tape   Social History:  The patient  reports that he quit smoking about 35 years ago. His smoking use included cigarettes. He has never used smokeless tobacco. He reports that he drinks alcohol. He reports that he does not use drugs.   Family History:  The patient's family history includes Hypertension in his father.  ROS:   Review of Systems  Constitutional: Positive for malaise/fatigue. Negative for chills, diaphoresis, fever and weight loss.  HENT: Negative for congestion.   Eyes: Negative for discharge and redness.  Respiratory: Positive for shortness of breath. Negative for cough, hemoptysis, sputum production and wheezing.   Cardiovascular: Positive for chest pain and leg swelling. Negative for palpitations, orthopnea, claudication and PND.  Gastrointestinal: Negative for abdominal pain, blood in stool, heartburn, melena, nausea and vomiting.  Genitourinary: Negative for hematuria.    Musculoskeletal: Negative for falls and myalgias.  Skin: Negative for rash.  Neurological: Positive for weakness. Negative for dizziness, tingling, tremors, sensory change, speech change, focal weakness and loss of consciousness.  Endo/Heme/Allergies: Does not bruise/bleed easily.  Psychiatric/Behavioral: Negative for substance abuse. The patient is not nervous/anxious.   All other systems reviewed and are negative.    PHYSICAL EXAM: VS:  BP 128/60 (BP Location: Left Arm, Patient Position: Sitting, Cuff Size: Normal)   Pulse 69   Ht 5\' 4"  (1.626 m)   Wt 180 lb 8 oz (81.9 kg)   BMI 30.98 kg/m  BMI: Body mass index is 30.98 kg/m.  Physical Exam  Constitutional: He is oriented to person, place, and time. He appears well-developed and well-nourished.  HENT:  Head: Normocephalic and atraumatic.  Eyes: Right eye exhibits no  discharge. Left eye exhibits no discharge.  Neck: Normal range of motion. No JVD present.  Cardiovascular: Normal rate, regular rhythm, S1 normal and S2 normal. Exam reveals no distant heart sounds, no friction rub, no midsystolic click and no opening snap.  Murmur heard. High-pitched blowing holosystolic murmur is present with a grade of 1/6 at the apex. Pulses:      Posterior tibial pulses are 2+ on the right side, and 2+ on the left side.  Pulmonary/Chest: Effort normal and breath sounds normal. No respiratory distress. He has no decreased breath sounds. He has no wheezes. He has no rales. He exhibits no tenderness.  Abdominal: Soft. He exhibits no distension. There is no tenderness.  Musculoskeletal: He exhibits edema.  1+ bilateral pitting lower extremity edema  Neurological: He is alert and oriented to person, place, and time.  Skin: Skin is warm and dry. No cyanosis. Nails show no clubbing.  Psychiatric: He has a normal mood and affect. His speech is normal and behavior is normal. Judgment and thought content normal.     EKG:  Was ordered and interpreted  by me today. Shows NSR, 67 bpm, prior inferior infarct, no acute st/t changes (unchanged)  Recent Labs: 07/25/2017: ALT 22 07/26/2017: Hemoglobin 11.1; Platelets 152 07/27/2017: BUN 36; Creatinine, Ser 1.08; Potassium 4.1; Sodium 135  No results found for requested labs within last 8760 hours.   CrCl cannot be calculated (Patient's most recent lab result is older than the maximum 21 days allowed.).   Wt Readings from Last 3 Encounters:  09/05/17 180 lb 8 oz (81.9 kg)  07/25/17 180 lb (81.6 kg)  07/21/17 182 lb 8 oz (82.8 kg)     Other studies reviewed: Additional studies/records reviewed today include: summarized above  ASSESSMENT AND PLAN:  1. HFpEF: He does not appear to be grossly volume up. Weight is down 2 pounds from his last office visit. Continue Lasix 20 mg daily. BMP pending. Optimal HR and BP control advised. Recent echo x 2 as above. CHF education.   2. Elevated troponin/chest pain with moderate risk for cardiac etiology: Felt to be supply demand ischemia in the setting of known CAD with CTO of the LCx and PNA. Currently chest pain free. Schedule Lexiscan Myoview to evaluate for high-risk ischemia.   3. CAD of the native coronary arteries with stable angina: Schedule Myoview as above. Continue ASA, Coreg, losartan, simvastatin. Aggressive secondary prevention with risk factor modification.   4. Venous insufficiency: Declines compression stockings. Elevate legs. Lasix as above.   5. HTN: Blood pressure is well controlled today. Continue current medications.   6. Chronic respiratory failure with hypoxia secondary to COPD: He does not appear to be having an active exacerbation. Followed by pulmonology.   7. Pulmonary nodules: Pulmonology is planning for repeat CT chest in 09/2017 per radiology recommendations. Patient will call us if he is unable to get this scan through pulmonology.   8. HLD: Recent LDL of 83 from 04/2017. Continue simvastatin.   9. Carotid artery ultrasound:  Was ordered in 06/2017, though this has not yet been completed given his recent admission. Will reschedule.   Disposition: F/u with Dr. Fletcher Anon or an APP in 4 weeks.   Current medicines are reviewed at length with the patient today.  The patient did not have any concerns regarding medicines.  Signed, Christell Faith, PA-C 09/05/2017 2:36 PM     [facility] [address], [place] [address] [phone]

## 2017-09-05 NOTE — Patient Instructions (Addendum)
Medication Instructions:  Your physician recommends that you continue on your current medications as directed. Please refer to the Current Medication list given to you today.   Labwork: Your physician recommends that you return for lab work in: today BMET.   Testing/Procedures: [place]  Your caregiver has ordered a Stress Test with nuclear imaging. The purpose of this test is to evaluate the blood supply to your heart muscle. This procedure is referred to as a "Non-Invasive Stress Test." This is because other than having an IV started in your vein, nothing is inserted or "invades" your body. Cardiac stress tests are done to find areas of poor blood flow to the heart by determining the extent of coronary artery disease (CAD). Some patients exercise on a treadmill, which naturally increases the blood flow to your heart, while others who are  unable to walk on a treadmill due to physical limitations have a pharmacologic/chemical stress agent called Lexiscan . This medicine will mimic walking on a treadmill by temporarily increasing your coronary blood flow.   Please note: these test may take anywhere between 2-4 hours to complete  PLEASE REPORT TO [facility] AT THE FIRST DESK WILL DIRECT YOU WHERE TO GO  Date of Procedure:_____________________________________  Arrival Time for Procedure:______________________________  Instructions regarding medication:   -----  Hold other medications as follows:________FUROSEMIDE___   PLEASE NOTIFY THE OFFICE AT LEAST 24 HOURS IN ADVANCE IF YOU ARE UNABLE TO KEEP YOUR APPOINTMENT.  [phone] AND  PLEASE NOTIFY NUCLEAR MEDICINE AT [facility] AT LEAST 24 HOURS IN ADVANCE IF YOU ARE UNABLE TO KEEP YOUR APPOINTMENT. [phone]  How to prepare for your Myoview test:  1. Do not eat or drink after midnight 2. No caffeine for 24 hours prior to test 3. No smoking 24 hours prior to test. 4. Your medication may be taken with  water.  If your doctor stopped a medication because of this test, do not take that medication. 5. Ladies, please do not wear dresses.  Skirts or pants are appropriate. Please wear a short sleeve shirt. 6. No perfume, cologne or lotion. 7. Wear comfortable walking shoes.    Follow-Up: Your physician recommends that you schedule a follow-up appointment in: [place] APP.   If you need a refill on your cardiac medications before your next appointment, please call your pharmacy.    Cardiac Nuclear Scan A cardiac nuclear scan is a test that measures blood flow to the heart when a person is resting and when he or she is exercising. The test looks for problems such as:  Not enough blood reaching a portion of the heart.  The heart muscle not working normally.  You may need this test if:  You have heart disease.  You have had abnormal lab results.  You have had heart surgery or angioplasty.  You have chest pain.  You have shortness of breath.  In this test, a radioactive dye (tracer) is injected into your bloodstream. After the tracer has traveled to your heart, an imaging device is used to measure how much of the tracer is absorbed by or distributed to various areas of your heart. This procedure is usually done at a hospital and takes 2-4 hours. Tell a health care provider about:  Any allergies you have.  All medicines you are taking, including vitamins, herbs, eye drops, creams, and over-the-counter medicines.  Any problems you or family members have had with the use of anesthetic medicines.  Any blood disorders you have.  Any surgeries you have had.  Any medical conditions you have.  Whether you are pregnant or may be pregnant. What are the risks? Generally, this is a safe procedure. However, problems may occur, including:  Serious chest pain and heart attack. This is only a risk if the stress portion of the test is done.  Rapid heartbeat.  Sensation  of warmth in your chest. This usually passes quickly.  What happens before the procedure?  Ask your health care provider about changing or stopping your regular medicines. This is especially important if you are taking diabetes medicines or blood thinners.  Remove your jewelry on the day of the procedure. What happens during the procedure?  An IV tube will be inserted into one of your veins.  Your health care provider will inject a small amount of radioactive tracer through the tube.  You will wait for 20-40 minutes while the tracer travels through your bloodstream.  Your heart activity will be monitored with an electrocardiogram (ECG).  You will lie down on an exam table.  Images of your heart will be taken for about 15-20 minutes.  You may be asked to exercise on a treadmill or stationary bike. While you exercise, your heart's activity will be monitored with an ECG, and your blood pressure will be checked. If you are unable to exercise, you may be given a medicine to increase blood flow to parts of your heart.  When blood flow to your heart has peaked, a tracer will again be injected through the IV tube.  After 20-40 minutes, you will get back on the exam table and have more images taken of your heart.  When the procedure is over, your IV tube will be removed. The procedure may vary among health care providers and hospitals. Depending on the type of tracer used, scans may need to be repeated 3-4 hours later. What happens after the procedure?  Unless your health care provider tells you otherwise, you may return to your normal schedule, including diet, activities, and medicines.  Unless your health care provider tells you otherwise, you may increase your fluid intake. This will help flush the contrast dye from your body. Drink enough fluid to keep your urine clear or pale yellow.  It is up to you to get your test results. Ask your health care provider, or the department that is  doing the test, when your results will be ready. Summary  A cardiac nuclear scan measures the blood flow to the heart when a person is resting and when he or she is exercising.  You may need this test if you are at risk for heart disease.  Tell your health care provider if you are pregnant.  Unless your health care provider tells you otherwise, increase your fluid intake. This will help flush the contrast dye from your body. Drink enough fluid to keep your urine clear or pale yellow. This information is not intended to replace advice given to you by your health care provider. Make sure you discuss any questions you have with your health care provider. Document Released: 03/04/2004 Document Revised: 02/10/2016 Document Reviewed: 01/16/2013 Elsevier Interactive Patient Education  2017 [employer].

## 2017-09-06 LAB — BASIC METABOLIC PANEL
BUN/Creatinine Ratio: 13 (ref 10–24)
BUN: 13 mg/dL (ref 8–27)
CALCIUM: 9.5 mg/dL (ref 8.6–10.2)
CO2: 24 mmol/L (ref 20–29)
CREATININE: 1 mg/dL (ref 0.76–1.27)
Chloride: 102 mmol/L (ref 96–106)
GFR calc Af Amer: 77 mL/min/{1.73_m2} (ref >59)
GFR, EST NON AFRICAN AMERICAN: 67 mL/min/{1.73_m2} (ref >59)
GLUCOSE: 96 mg/dL (ref 65–99)
POTASSIUM: 4.4 mmol/L (ref 3.5–5.2)
SODIUM: 141 mmol/L (ref 134–144)

## 2017-09-07 DIAGNOSIS — J440 Chronic obstructive pulmonary disease with acute lower respiratory infection: Secondary | ICD-10-CM | POA: Diagnosis not present

## 2017-09-07 DIAGNOSIS — I2510 Atherosclerotic heart disease of native coronary artery without angina pectoris: Secondary | ICD-10-CM | POA: Diagnosis not present

## 2017-09-07 DIAGNOSIS — J181 Lobar pneumonia, unspecified organism: Secondary | ICD-10-CM | POA: Diagnosis not present

## 2017-09-07 DIAGNOSIS — J441 Chronic obstructive pulmonary disease with (acute) exacerbation: Secondary | ICD-10-CM | POA: Diagnosis not present

## 2017-09-07 DIAGNOSIS — J9601 Acute respiratory failure with hypoxia: Secondary | ICD-10-CM | POA: Diagnosis not present

## 2017-09-07 DIAGNOSIS — I110 Hypertensive heart disease with heart failure: Secondary | ICD-10-CM | POA: Diagnosis not present

## 2017-09-13 DIAGNOSIS — I110 Hypertensive heart disease with heart failure: Secondary | ICD-10-CM | POA: Diagnosis not present

## 2017-09-13 DIAGNOSIS — J440 Chronic obstructive pulmonary disease with acute lower respiratory infection: Secondary | ICD-10-CM | POA: Diagnosis not present

## 2017-09-13 DIAGNOSIS — J441 Chronic obstructive pulmonary disease with (acute) exacerbation: Secondary | ICD-10-CM | POA: Diagnosis not present

## 2017-09-13 DIAGNOSIS — J9601 Acute respiratory failure with hypoxia: Secondary | ICD-10-CM | POA: Diagnosis not present

## 2017-09-13 DIAGNOSIS — I2510 Atherosclerotic heart disease of native coronary artery without angina pectoris: Secondary | ICD-10-CM | POA: Diagnosis not present

## 2017-09-13 DIAGNOSIS — J181 Lobar pneumonia, unspecified organism: Secondary | ICD-10-CM | POA: Diagnosis not present

## 2017-09-14 ENCOUNTER — Ambulatory Visit
Admission: RE | Admit: 2017-09-14 | Discharge: 2017-09-14 | Disposition: A | Discharge status: Home / Self Care | Payer: Medicare Other | Source: Ambulatory Visit | Attending: Physician Assistant | Admitting: Physician Assistant

## 2017-09-14 DIAGNOSIS — R748 Abnormal levels of other serum enzymes: Secondary | ICD-10-CM

## 2017-09-14 DIAGNOSIS — R7989 Other specified abnormal findings of blood chemistry: Secondary | ICD-10-CM | POA: Insufficient documentation

## 2017-09-14 DIAGNOSIS — I5032 Chronic diastolic (congestive) heart failure: Secondary | ICD-10-CM

## 2017-09-14 DIAGNOSIS — R778 Other specified abnormalities of plasma proteins: Secondary | ICD-10-CM

## 2017-09-14 LAB — NM MYOCAR MULTI W/SPECT W/WALL MOTION / EF
CSEPHR: 60 %
CSEPPHR: 80 {beats}/min
LV sys vol: 37 mL
LVDIAVOL: 109 mL (ref 62–150)
Rest HR: 64 {beats}/min
SDS: 0
SRS: 20
SSS: 12
TID: 1.02

## 2017-09-14 MED ORDER — TECHNETIUM TC 99M TETROFOSMIN IV KIT
30.6640 | PACK | Freq: Once | INTRAVENOUS | Status: AC | PRN
  Administered 2017-09-14: 30.664 via INTRAVENOUS

## 2017-09-14 MED ORDER — TECHNETIUM TC 99M TETROFOSMIN IV KIT
13.6800 | PACK | Freq: Once | INTRAVENOUS | Status: AC | PRN
  Administered 2017-09-14: 13.68 via INTRAVENOUS

## 2017-09-14 MED ORDER — REGADENOSON 0.4 MG/5ML IV SOLN
0.4000 mg | Freq: Once | INTRAVENOUS | Status: AC
  Administered 2017-09-14: 0.4 mg via INTRAVENOUS

## 2017-09-15 DIAGNOSIS — J441 Chronic obstructive pulmonary disease with (acute) exacerbation: Secondary | ICD-10-CM | POA: Diagnosis not present

## 2017-09-15 DIAGNOSIS — J440 Chronic obstructive pulmonary disease with acute lower respiratory infection: Secondary | ICD-10-CM | POA: Diagnosis not present

## 2017-09-15 DIAGNOSIS — J9601 Acute respiratory failure with hypoxia: Secondary | ICD-10-CM | POA: Diagnosis not present

## 2017-09-15 DIAGNOSIS — I2510 Atherosclerotic heart disease of native coronary artery without angina pectoris: Secondary | ICD-10-CM | POA: Diagnosis not present

## 2017-09-15 DIAGNOSIS — J181 Lobar pneumonia, unspecified organism: Secondary | ICD-10-CM | POA: Diagnosis not present

## 2017-09-15 DIAGNOSIS — I110 Hypertensive heart disease with heart failure: Secondary | ICD-10-CM | POA: Diagnosis not present

## 2017-09-19 DIAGNOSIS — J441 Chronic obstructive pulmonary disease with (acute) exacerbation: Secondary | ICD-10-CM | POA: Diagnosis not present

## 2017-09-19 DIAGNOSIS — J440 Chronic obstructive pulmonary disease with acute lower respiratory infection: Secondary | ICD-10-CM | POA: Diagnosis not present

## 2017-09-19 DIAGNOSIS — J181 Lobar pneumonia, unspecified organism: Secondary | ICD-10-CM | POA: Diagnosis not present

## 2017-09-19 DIAGNOSIS — I110 Hypertensive heart disease with heart failure: Secondary | ICD-10-CM | POA: Diagnosis not present

## 2017-09-19 DIAGNOSIS — I2510 Atherosclerotic heart disease of native coronary artery without angina pectoris: Secondary | ICD-10-CM | POA: Diagnosis not present

## 2017-09-19 DIAGNOSIS — J9601 Acute respiratory failure with hypoxia: Secondary | ICD-10-CM | POA: Diagnosis not present

## 2017-09-22 DIAGNOSIS — J9601 Acute respiratory failure with hypoxia: Secondary | ICD-10-CM | POA: Diagnosis not present

## 2017-09-22 DIAGNOSIS — I2510 Atherosclerotic heart disease of native coronary artery without angina pectoris: Secondary | ICD-10-CM | POA: Diagnosis not present

## 2017-09-22 DIAGNOSIS — I110 Hypertensive heart disease with heart failure: Secondary | ICD-10-CM | POA: Diagnosis not present

## 2017-09-22 DIAGNOSIS — J441 Chronic obstructive pulmonary disease with (acute) exacerbation: Secondary | ICD-10-CM | POA: Diagnosis not present

## 2017-09-22 DIAGNOSIS — J181 Lobar pneumonia, unspecified organism: Secondary | ICD-10-CM | POA: Diagnosis not present

## 2017-09-22 DIAGNOSIS — J440 Chronic obstructive pulmonary disease with acute lower respiratory infection: Secondary | ICD-10-CM | POA: Diagnosis not present

## 2017-09-26 DIAGNOSIS — J181 Lobar pneumonia, unspecified organism: Secondary | ICD-10-CM | POA: Diagnosis not present

## 2017-09-26 DIAGNOSIS — I2510 Atherosclerotic heart disease of native coronary artery without angina pectoris: Secondary | ICD-10-CM | POA: Diagnosis not present

## 2017-09-26 DIAGNOSIS — J9601 Acute respiratory failure with hypoxia: Secondary | ICD-10-CM | POA: Diagnosis not present

## 2017-09-26 DIAGNOSIS — J440 Chronic obstructive pulmonary disease with acute lower respiratory infection: Secondary | ICD-10-CM | POA: Diagnosis not present

## 2017-09-26 DIAGNOSIS — I110 Hypertensive heart disease with heart failure: Secondary | ICD-10-CM | POA: Diagnosis not present

## 2017-09-26 DIAGNOSIS — J441 Chronic obstructive pulmonary disease with (acute) exacerbation: Secondary | ICD-10-CM | POA: Diagnosis not present

## 2017-10-03 ENCOUNTER — Other Ambulatory Visit: Payer: Self-pay | Admitting: Specialist

## 2017-10-03 DIAGNOSIS — R918 Other nonspecific abnormal finding of lung field: Secondary | ICD-10-CM

## 2017-10-04 ENCOUNTER — Telehealth: Payer: Self-pay | Admitting: Cardiovascular Disease

## 2017-10-04 NOTE — Telephone Encounter (Signed)
Patient calling and has his CT is scheduled for 8/29 Patient is seeing R. Dunn on 8/19 and would like to know if it should be rescheduled til after he has had CT done Please call to discuss

## 2017-10-04 NOTE — Telephone Encounter (Signed)
Appointment has been cancelled per the patient request and one made for 8/30 with Dr. Fletcher Anon following his CT on 8/29.

## 2017-10-09 ENCOUNTER — Ambulatory Visit: Payer: Medicare Other | Admitting: Physician Assistant

## 2017-10-19 ENCOUNTER — Ambulatory Visit
Admission: RE | Admit: 2017-10-19 | Discharge: 2017-10-19 | Disposition: A | Discharge status: Home / Self Care | Payer: Medicare Other | Source: Ambulatory Visit | Attending: Infectious Diseases | Admitting: Infectious Diseases

## 2017-10-19 DIAGNOSIS — J432 Centrilobular emphysema: Secondary | ICD-10-CM | POA: Diagnosis not present

## 2017-10-19 DIAGNOSIS — R918 Other nonspecific abnormal finding of lung field: Secondary | ICD-10-CM | POA: Diagnosis not present

## 2017-10-19 DIAGNOSIS — J189 Pneumonia, unspecified organism: Secondary | ICD-10-CM | POA: Diagnosis not present

## 2017-10-19 DIAGNOSIS — K449 Diaphragmatic hernia without obstruction or gangrene: Secondary | ICD-10-CM | POA: Diagnosis not present

## 2017-10-19 DIAGNOSIS — I700 Atherosclerosis of aorta: Secondary | ICD-10-CM | POA: Insufficient documentation

## 2017-10-20 ENCOUNTER — Encounter: Payer: Self-pay | Admitting: Cardiovascular Disease

## 2017-10-20 ENCOUNTER — Ambulatory Visit (INDEPENDENT_AMBULATORY_CARE_PROVIDER_SITE_OTHER): Payer: Medicare Other | Admitting: Cardiovascular Disease

## 2017-10-20 VITALS — BP 130/68 | HR 68 | Ht 64.0 in | Wt 178.0 lb

## 2017-10-20 DIAGNOSIS — I6522 Occlusion and stenosis of left carotid artery: Secondary | ICD-10-CM

## 2017-10-20 DIAGNOSIS — I25118 Atherosclerotic heart disease of native coronary artery with other forms of angina pectoris: Secondary | ICD-10-CM

## 2017-10-20 DIAGNOSIS — I6523 Occlusion and stenosis of bilateral carotid arteries: Secondary | ICD-10-CM | POA: Diagnosis not present

## 2017-10-20 DIAGNOSIS — I10 Essential (primary) hypertension: Secondary | ICD-10-CM

## 2017-10-20 DIAGNOSIS — E785 Hyperlipidemia, unspecified: Secondary | ICD-10-CM

## 2017-10-20 NOTE — Progress Notes (Signed)
Cardiology Office Note   Date:  10/20/2017   ID:  Anthony Johns, DOB 1927/07/22, MRN [id number]  PCP:  Anthony Hire, MD  Cardiologist:   Anthony Sacramento, MD   Chief Complaint  Patient presents with  . other    Follow up from Anthony Johns. Meds reviewed by the pt. verbally. Pt. c/o shortness of breath.       History of Present Illness: Anthony Johns is a 84 y.o. male who presents for a follow-up visit regarding coronary artery disease.  He has extensive history of ischemic heart disease with previous myocardial infarction at the age of 27. He had multiple PCI's done. Most recent cardiac cath that I have on record is from February 2010 which showed mild LAD disease, occluded mid left circumflex with good collaterals, and 85% stenosis in the distal RCA. Ejection fraction was mildly reduced. He underwent successful angioplasty on the distal right coronary artery with placement of a 3.0 x 18 mm vision bare-metal stent.  The patient had worsening dyspnea which happened after he developed pneumonia.  He was seen by Anthony Johns in July.  Echocardiogram in May showed normal LV systolic function with grade 2 diastolic dysfunction?  Normal pulmonary pressure.  He went to [facility] in June and was diagnosed with pneumonia.  His troponin was mildly elevated at 0.21. He underwent a Lexiscan Myoview which showed no evidence of ischemia with normal ejection fraction.  He reports that his chest pain is actually less than before with occasional jaw discomfort.  This pattern has been stable.  Usually the discomfort resolves with rest and he rarely has to take nitroglycerin.  His shortness of breath also seems to be improving but not back to baseline since the pneumonia.  He had CT scan done yesterday ordered by his primary care physician to follow-up on pneumonia and also pulmonary nodules.   Past Medical History:  Diagnosis Date  . (HFpEF) heart failure with preserved ejection fraction ([place])    a. 06/2017  Echo: EF 60-65%, no rwma, Gr2 DD, mild MR, nl LA size, nl RV fxn, PASP wnl.  . Coronary artery disease    a. He reports having myocardial infarction at the age of 26. He reports having multiple PCI's; b. 03/2008 Cath/PCI: LAD mild dzs, LCX 125m, RCA 85d (3.0x18 Vision BMS); c. 07/2016 MV: EF 55-65%, mid inf/inflat infarct w/o ischemia.  . Essential hypertension   . Hiatal hernia   . Hypercalcemia   . Hyperlipidemia   . Hypertension   . Ischemic heart disease   . MI (myocardial infarction) [facility])     Past Surgical History:  Procedure Laterality Date  . CARDIAC CATHETERIZATION     [facility]  . CORONARY ANGIOPLASTY WITH STENT PLACEMENT       Current Outpatient Medications  Medication Sig Dispense Refill  . albuterol (PROVENTIL HFA;VENTOLIN HFA) 108 (90 Base) MCG/ACT inhaler Inhale 2 puffs every 4-6 hours as needed for wheezing. 1 Inhaler 2  . amLODipine (NORVASC) 10 MG tablet Take 10 mg by mouth daily.     Marland Kitchen aspirin EC 81 MG tablet Take 81 mg by mouth daily.     . carvedilol (COREG) 6.25 MG tablet TAKE 1 TABLET TWICE A DAY 180 tablet 0  . furosemide (LASIX) 20 MG tablet Take 1 tablet (20 mg total) by mouth daily. 30 tablet 3  . guaiFENesin-dextromethorphan (ROBITUSSIN DM) 100-10 MG/5ML syrup Take 5 mLs by mouth every 6 (six) hours as needed for cough. 118 mL  0  . losartan (COZAAR) 100 MG tablet Take 100 mg by mouth daily.     . magnesium oxide (MAG-OX) 400 MG tablet Take 400 mg by mouth daily.     . nitroGLYCERIN (NITROSTAT) 0.4 MG SL tablet Place 0.4 mg under the tongue every 5 (five) minutes as needed.     Marland Kitchen omeprazole (PRILOSEC) 20 MG capsule TAKE 1 CAPSULE DAILY    . OXYGEN Inhale 2 L into the lungs daily.    . sertraline (ZOLOFT) 25 MG tablet Take 25 mg by mouth daily.    . simvastatin (ZOCOR) 20 MG tablet Take 20 mg by mouth daily at 6 PM.      No current facility-administered medications for this visit.     Allergies:   Tape    Social History:  The patient  reports that he quit  smoking about 35 years ago. His smoking use included cigarettes. He has never used smokeless tobacco. He reports that he drinks alcohol. He reports that he does not use drugs.   Family History:  The patient's family history includes Hypertension in his father.    ROS:  Please see the history of present illness.   Otherwise, review of systems are positive for none.   All other systems are reviewed and negative.    PHYSICAL EXAM: VS:  BP 130/68 (BP Location: Left Arm, Patient Position: Sitting, Cuff Size: Normal)   Pulse 68   Ht 5\' 4"  (1.626 m)   Wt 178 lb (80.7 kg)   SpO2 98%   BMI 30.55 kg/m  , BMI Body mass index is 30.55 kg/m. GEN: Well nourished, well developed, in no acute distress  HEENT: normal  Neck: no JVD, or masses . Right carotid bruit. Cardiac: RRR; no  rubs, or gallops,no edema . 1 / 6 systolic murmur at the left sternal border Respiratory:  clear to auscultation bilaterally, normal work of breathing GI: soft, nontender, nondistended, + BS MS: no deformity or atrophy  Skin: warm and dry, no rash Neuro:  Strength and sensation are intact Psych: euthymic mood, full affect   EKG:  EKG is not ordered today.    Recent Labs: 07/25/2017: ALT 22 07/26/2017: Hemoglobin 11.1; Platelets 152 09/05/2017: BUN 13; Creatinine, Ser 1.00; Potassium 4.4; Sodium 141    Lipid Panel No results found for: CHOL, TRIG, HDL, CHOLHDL, VLDL, LDLCALC, LDLDIRECT    Wt Readings from Last 3 Encounters:  10/20/17 178 lb (80.7 kg)  09/05/17 180 lb 8 oz (81.9 kg)  07/25/17 180 lb (81.6 kg)         ASSESSMENT AND PLAN:  1.  Coronary artery disease involving native coronary arteries with stable angina:  He has stable class II angina. Continue current medical therapy.  Recent stress test was normal.  It has been many years since his stent placement and thus I elected to discontinue Plavix.  Continue aspirin indefinitely.  2. Left carotid stenosis: Most recent carotid Doppler in May of this  year showed 60 to 79% left ICA stenosis.  Repeat study in May 2020.  3. Essential hypertension:  Blood pressure is controlled on current medications.  4. Hyperlipidemia: Continue treatment with simvastatin.  Most recent lipid profile in March showed an LDL of 83.  We should consider switching him to atorvastatin or rosuvastatin.   Disposition:   FU with me in 6 months  Signed,  Anthony Sacramento, MD  10/20/2017 10:49 AM    [place]

## 2017-10-20 NOTE — Patient Instructions (Signed)
Medication Instructions: STOP the Plavix  If you need a refill on your cardiac medications before your next appointment, please call your pharmacy.    Follow-Up: Your physician wants you to follow-up in 6 months with Dr. Fletcher Anon. You will receive a reminder letter in the mail two months in advance. If you don't receive a letter, please call our office at [phone] to schedule this follow-up appointment.  Thank you for choosing Heartcare at [facility]!

## 2017-10-24 ENCOUNTER — Ambulatory Visit: Payer: Medicare Other | Admitting: Cardiovascular Disease

## 2017-11-25 ENCOUNTER — Other Ambulatory Visit: Payer: Self-pay | Admitting: Cardiovascular Disease

## 2017-12-05 DIAGNOSIS — F329 Major depressive disorder, single episode, unspecified: Secondary | ICD-10-CM | POA: Diagnosis not present

## 2017-12-05 DIAGNOSIS — I10 Essential (primary) hypertension: Secondary | ICD-10-CM | POA: Diagnosis not present

## 2017-12-05 DIAGNOSIS — J9611 Chronic respiratory failure with hypoxia: Secondary | ICD-10-CM | POA: Diagnosis not present

## 2017-12-05 DIAGNOSIS — E785 Hyperlipidemia, unspecified: Secondary | ICD-10-CM | POA: Diagnosis not present

## 2017-12-05 DIAGNOSIS — K582 Mixed irritable bowel syndrome: Secondary | ICD-10-CM | POA: Diagnosis not present

## 2017-12-05 DIAGNOSIS — I259 Chronic ischemic heart disease, unspecified: Secondary | ICD-10-CM | POA: Diagnosis not present

## 2017-12-05 DIAGNOSIS — Z23 Encounter for immunization: Secondary | ICD-10-CM | POA: Diagnosis not present

## 2017-12-18 DIAGNOSIS — R918 Other nonspecific abnormal finding of lung field: Secondary | ICD-10-CM | POA: Diagnosis not present

## 2017-12-19 ENCOUNTER — Other Ambulatory Visit: Payer: Self-pay | Admitting: Specialist

## 2017-12-19 DIAGNOSIS — R918 Other nonspecific abnormal finding of lung field: Secondary | ICD-10-CM

## 2018-05-12 DIAGNOSIS — T148XXA Other injury of unspecified body region, initial encounter: Secondary | ICD-10-CM | POA: Diagnosis not present

## 2018-05-12 DIAGNOSIS — S51812A Laceration without foreign body of left forearm, initial encounter: Secondary | ICD-10-CM | POA: Diagnosis not present

## 2018-05-12 DIAGNOSIS — W19XXXA Unspecified fall, initial encounter: Secondary | ICD-10-CM | POA: Diagnosis not present

## 2018-05-25 ENCOUNTER — Other Ambulatory Visit: Payer: Self-pay | Admitting: Cardiovascular Disease

## 2018-06-06 DIAGNOSIS — F329 Major depressive disorder, single episode, unspecified: Secondary | ICD-10-CM | POA: Diagnosis not present

## 2018-06-06 DIAGNOSIS — I259 Chronic ischemic heart disease, unspecified: Secondary | ICD-10-CM | POA: Diagnosis not present

## 2018-06-06 DIAGNOSIS — Z0000 Encounter for general adult medical examination without abnormal findings: Secondary | ICD-10-CM | POA: Diagnosis not present

## 2018-06-06 DIAGNOSIS — R1032 Left lower quadrant pain: Secondary | ICD-10-CM | POA: Diagnosis not present

## 2018-06-06 DIAGNOSIS — I10 Essential (primary) hypertension: Secondary | ICD-10-CM | POA: Diagnosis not present

## 2018-06-06 DIAGNOSIS — E785 Hyperlipidemia, unspecified: Secondary | ICD-10-CM | POA: Diagnosis not present

## 2018-06-06 DIAGNOSIS — J9611 Chronic respiratory failure with hypoxia: Secondary | ICD-10-CM | POA: Diagnosis not present

## 2018-06-06 DIAGNOSIS — K582 Mixed irritable bowel syndrome: Secondary | ICD-10-CM | POA: Diagnosis not present

## 2018-07-30 ENCOUNTER — Ambulatory Visit (INDEPENDENT_AMBULATORY_CARE_PROVIDER_SITE_OTHER): Payer: Medicare Other

## 2018-07-30 ENCOUNTER — Other Ambulatory Visit: Payer: Self-pay

## 2018-07-30 ENCOUNTER — Other Ambulatory Visit: Payer: Self-pay | Admitting: Nurse Practitioner

## 2018-07-30 DIAGNOSIS — I739 Peripheral vascular disease, unspecified: Secondary | ICD-10-CM

## 2018-07-30 DIAGNOSIS — I6523 Occlusion and stenosis of bilateral carotid arteries: Secondary | ICD-10-CM

## 2018-07-30 DIAGNOSIS — I779 Disorder of arteries and arterioles, unspecified: Secondary | ICD-10-CM

## 2018-07-31 ENCOUNTER — Telehealth: Payer: Self-pay | Admitting: *Deleted

## 2018-07-31 DIAGNOSIS — I6523 Occlusion and stenosis of bilateral carotid arteries: Secondary | ICD-10-CM

## 2018-07-31 NOTE — Telephone Encounter (Signed)
-----   Message from Theora Gianotti, NP sent at 07/31/2018  6:20 AM EDT ----- 1-39% right internal carotid dzs and 40-59% left internal carotid disease.  Stable compared to last year.  F/u in 1 yr.

## 2018-07-31 NOTE — Telephone Encounter (Signed)
Results called to pt. Pt verbalized understanding. Repeat carotid doppler for in 1 year entered.

## 2018-08-31 ENCOUNTER — Ambulatory Visit: Payer: Medicare Other | Admitting: Cardiovascular Disease

## 2018-09-02 NOTE — Progress Notes (Signed)
Cardiology Office Note Date:  09/04/2018  Patient ID:  Anthony Johns, Anthony Johns 08-11-1927, MRN [id number] PCP:  Baxter Hire, MD  Cardiologist:  Dr. Fletcher Anon, MD    Chief Complaint: Follow up  History of Present Illness: Anthony Johns is a 85 y.o. male with history of CAD status post prior MI with multiple PCI's including BMS of the RCA in 2010, known CTO of the LCx, left carotid artery disease, venous insufficiency, HTN, HLD, and hiatal hernia who presents for follow-up of his CAD.   Patient has an extensive history of ischemic heart disease with prior MI at age 70.  He has had multiple PCIs including, most recently, BMS to the distal RCA secondary to 85% stenosis in 03/2008. He has a known CTO of the LCx with good collaterals and mild LAD disease from most recent cardiac cath on file dated 03/2008.   In the spring of 2019 he was diagnosed with pneumonia. Follow-up chest x-ray showed persistent opacities and a CT scan was performed in 05/2017 which showed scattered bilateral pulmonary nodules. Due to continued shortness of breath and lower extremity swelling he underwent echo on 07/13/2017 that showed an EF of 60 to 65%, no regional wall motion abnormalities, grade 2 diastolic dysfunction, mild MR, left atrium normal in size, RV systolic function normal, PASP normal. In 07/2017 he was admitted to the hospital with pneumonia.  Troponin was mildly elevated peaking at 0.21.  Repeat echo on 07/26/17 showed an EF of 60-65%, no RWMA, Gr2DD, mild MR, RVSF normal, PASP normal. His elevated troponin was felt to be supply demand ischemia in the setting of known CAD with an occluded LCx.  He underwent outpatient Lexiscan Myoview in 08/2017 which showed no evidence of ischemia with a normal EF.  He was most recently seen by Dr. Fletcher Anon on 10/20/2017 and reported his chest pain was actually less than before with occasional jaw discomfort.  This pattern was noted to be stable.  His shortness of breath also seem to be  improving but was not yet back to his baseline prior to his pneumonia.  Follow-up CT chest on 10/19/2017 to evaluate his recent pneumonia and pulmonary nodules showed a stable bilateral pulmonary nodules with no evidence of infection.  Most recent carotid artery ultrasound from 07/30/2018 showed 1 to 39% right internal carotid artery stenosis, 40 to 59% left internal carotid artery stenosis (prior 60 to 79% stenosis in 06/2017), bilateral vertebral arteries with antegrade flow with stenotic right subclavian artery and disturbed flow along the left subclavian artery.   Patient comes in doing well from a cardiac perspective.  Since he was last seen, he has self tapered himself off supplemental oxygen and is now on room air.  He states his breathing is at his approximate baseline.  He does continue to have intermittent brief episodes of chest pain lasting for several seconds at a time that are similar to his presentation when seen in 09/2017 he states "I am doing good for man my age."  He has follow-up with pulmonology next month as well as follow-up CT chest to evaluate pulmonary nodules.  He continues to eat a diet high in sodium prepared foods and is unwilling to cook.  He has stable mild lower extremity swelling with no associated abdominal distention, orthopnea, PND, early satiety.  No falls since he was last seen.  He does not regularly check his blood pressure.  Labs: 05/2018- Hgb 12.5, potassium 4.6, serum creatinine 1.1, AST/LT normal, albumin 4.4, triglyceride  107, LDL 82  Past Medical History:  Diagnosis Date   (HFpEF) heart failure with preserved ejection fraction ([place])    a. 06/2017 Echo: EF 60-65%, no rwma, Gr2 DD, mild MR, nl LA size, nl RV fxn, PASP wnl.   Coronary artery disease    a. He reports having myocardial infarction at the age of 75. He reports having multiple PCI's; b. 03/2008 Cath/PCI: LAD mild dzs, LCX 161m, RCA 85d (3.0x18 Vision BMS); c. 07/2016 MV: EF 55-65%, mid inf/inflat infarct  w/o ischemia.   Essential hypertension    Hiatal hernia    Hypercalcemia    Hyperlipidemia    Hypertension    Ischemic heart disease    MI (myocardial infarction) ([place])     Past Surgical History:  Procedure Laterality Date   CARDIAC CATHETERIZATION     [facility]   CORONARY ANGIOPLASTY WITH STENT PLACEMENT      Current Meds  Medication Sig   amLODipine (NORVASC) 10 MG tablet Take 10 mg by mouth daily.    aspirin EC 81 MG tablet Take 81 mg by mouth daily.    carvedilol (COREG) 6.25 MG tablet TAKE 1 TABLET TWICE A DAY   guaiFENesin-dextromethorphan (ROBITUSSIN DM) 100-10 MG/5ML syrup Take 5 mLs by mouth every 6 (six) hours as needed for cough.   losartan (COZAAR) 100 MG tablet Take 100 mg by mouth daily.    magnesium oxide (MAG-OX) 400 MG tablet Take 400 mg by mouth daily.    nitroGLYCERIN (NITROSTAT) 0.4 MG SL tablet Place 0.4 mg under the tongue every 5 (five) minutes as needed.    omeprazole (PRILOSEC) 20 MG capsule TAKE 1 CAPSULE DAILY   sertraline (ZOLOFT) 25 MG tablet Take 25 mg by mouth daily.   simvastatin (ZOCOR) 20 MG tablet Take 20 mg by mouth daily at 6 PM.     Allergies:   Tape   Social History:  The patient  reports that he quit smoking about 36 years ago. His smoking use included cigarettes. He has never used smokeless tobacco. He reports current alcohol use. He reports that he does not use drugs.   Family History:  The patient's family history includes Hypertension in his father.  ROS:   Review of Systems  Constitutional: Positive for malaise/fatigue. Negative for chills, diaphoresis, fever and weight loss.  HENT: Negative for congestion.   Eyes: Negative for discharge and redness.  Respiratory: Positive for shortness of breath. Negative for cough, hemoptysis, sputum production and wheezing.   Cardiovascular: Positive for chest pain and leg swelling. Negative for palpitations, orthopnea, claudication and PND.  Gastrointestinal: Negative for  abdominal pain, blood in stool, heartburn, melena, nausea and vomiting.  Genitourinary: Negative for hematuria.  Musculoskeletal: Negative for falls and myalgias.  Skin: Negative for rash.  Neurological: Negative for dizziness, tingling, tremors, sensory change, speech change, focal weakness, loss of consciousness and weakness.  Endo/Heme/Allergies: Does not bruise/bleed easily.  Psychiatric/Behavioral: Negative for substance abuse. The patient is not nervous/anxious.   All other systems reviewed and are negative.    PHYSICAL EXAM:  VS:  BP (!) 150/80 (BP Location: Left Arm, Patient Position: Sitting, Cuff Size: Normal)    Pulse 75    Temp (!) 97.5 F (36.4 C)    Ht 5\' 6"  (1.676 m)    Wt 181 lb 12 oz (82.4 kg)    SpO2 94%    BMI 29.34 kg/m  BMI: Body mass index is 29.34 kg/m.  Physical Exam  Constitutional: He is oriented to person, place,  and time. He appears well-developed and well-nourished.  HENT:  Head: Normocephalic and atraumatic.  Eyes: Right eye exhibits no discharge. Left eye exhibits no discharge.  Neck: Normal range of motion. No JVD present.  Cardiovascular: Normal rate, regular rhythm, S1 normal and S2 normal. Exam reveals no distant heart sounds, no friction rub, no midsystolic click and no opening snap.  Murmur heard. Pulses:      Carotid pulses are on the right side with bruit.      Posterior tibial pulses are 2+ on the right side and 2+ on the left side.  I/VI systolic murmur along the left sternal border  Pulmonary/Chest: Effort normal and breath sounds normal. No respiratory distress. He has no decreased breath sounds. He has no wheezes. He has no rales. He exhibits no tenderness.  Abdominal: Soft. He exhibits no distension. There is no abdominal tenderness.  Musculoskeletal:        General: Edema present.     Comments: Trace bilateral pretibial edema  Neurological: He is alert and oriented to person, place, and time.  Skin: Skin is warm and dry. No cyanosis.  Nails show no clubbing.  Psychiatric: He has a normal mood and affect. His speech is normal and behavior is normal. Judgment and thought content normal.     EKG:  Was ordered and interpreted by me today. Shows NSR, 75 bpm, LVH, possible prior septal infarct, inferior lateral T wave inversion  Recent Labs: 09/05/2017: BUN 13; Creatinine, Ser 1.00; Potassium 4.4; Sodium 141  No results found for requested labs within last 8760 hours.   CrCl cannot be calculated (Patient's most recent lab result is older than the maximum 21 days allowed.).   Wt Readings from Last 3 Encounters:  09/04/18 181 lb 12 oz (82.4 kg)  10/20/17 178 lb (80.7 kg)  09/05/17 180 lb 8 oz (81.9 kg)     Other studies reviewed: Additional studies/records reviewed today include: summarized above  ASSESSMENT AND PLAN:  1. CAD involving the native coronary arteries with stable angina: He is currently chest pain-free.  He notes stable class II angina with recent [place] showing no significant ischemia.  Given stable symptoms we will continue current medical therapy including aspirin, amlodipine, carvedilol, and simvastatin.  Aggressive risk factor modification.  2. Left carotid artery stenosis: Most recent carotid artery ultrasound from 07/2018 demonstrated improvement from 60 to 79% stenosis to 40 to 59% stenosis.  Continue current dose of simvastatin.  Follow-up 12 months.  3. COPD: Stable and currently without exacerbation.  Patient has self weaned from supplemental oxygen.  O2 saturation of 94% in the office today.  I recommend he follow-up with pulmonology.  4. Hypertension: Blood pressure is mildly elevated at 150/80 today.  This is likely in setting of his high sodium diet.  Recommend low sodium intake and continuation of current dose of amlodipine, carvedilol, and losartan.  Most recent potassium and renal function stable.  5. Hyperlipidemia: Most recent LDL of 82 from 05/2018.  Remains on simvastatin.   Consider transition to Lipitor or Crestor in follow-up.  6. Lower extremity swelling: Likely multifactorial including dependent edema, component of venous insufficiency, and calcium channel blocker usage.  Recommend leg elevation and compression stockings.  He does not appear volume overloaded.  Disposition: F/u with Dr. Fletcher Anon or an APP in 6 months, sooner as needed.  Current medicines are reviewed at length with the patient today.  The patient did not have any concerns regarding medicines.  Signed, Christell Faith, PA-C 09/04/2018  3:43 PM     [address], [place] [id number] [phone]

## 2018-09-04 ENCOUNTER — Other Ambulatory Visit: Payer: Self-pay

## 2018-09-04 ENCOUNTER — Ambulatory Visit (INDEPENDENT_AMBULATORY_CARE_PROVIDER_SITE_OTHER): Payer: Medicare Other | Admitting: Physician Assistant

## 2018-09-04 ENCOUNTER — Encounter: Payer: Self-pay | Admitting: Physician Assistant

## 2018-09-04 VITALS — BP 150/80 | HR 75 | Temp 97.5°F | Ht 66.0 in | Wt 181.8 lb

## 2018-09-04 DIAGNOSIS — J449 Chronic obstructive pulmonary disease, unspecified: Secondary | ICD-10-CM

## 2018-09-04 DIAGNOSIS — I10 Essential (primary) hypertension: Secondary | ICD-10-CM | POA: Diagnosis not present

## 2018-09-04 DIAGNOSIS — I6522 Occlusion and stenosis of left carotid artery: Secondary | ICD-10-CM

## 2018-09-04 DIAGNOSIS — I6523 Occlusion and stenosis of bilateral carotid arteries: Secondary | ICD-10-CM | POA: Diagnosis not present

## 2018-09-04 DIAGNOSIS — I25118 Atherosclerotic heart disease of native coronary artery with other forms of angina pectoris: Secondary | ICD-10-CM

## 2018-09-04 DIAGNOSIS — I872 Venous insufficiency (chronic) (peripheral): Secondary | ICD-10-CM | POA: Diagnosis not present

## 2018-09-04 DIAGNOSIS — E785 Hyperlipidemia, unspecified: Secondary | ICD-10-CM | POA: Diagnosis not present

## 2018-09-04 NOTE — Patient Instructions (Signed)
Medication Instructions:  Your physician recommends that you continue on your current medications as directed. Please refer to the Current Medication list given to you today.  If you need a refill on your cardiac medications before your next appointment, please call your pharmacy.   Lab work: None  If you have labs (blood work) drawn today and your tests are completely normal, you will receive your results only by: Marland Kitchen MyChart Message (if you have MyChart) OR . A paper copy in the mail If you have any lab test that is abnormal or we need to change your treatment, we will call you to review the results.  Testing/Procedures: None  Follow-Up: At [facility], you and your health needs are our priority.  As part of our continuing mission to provide you with exceptional heart care, we have created designated Provider Care Teams.  These Care Teams include your primary Cardiologist (physician) and Advanced Practice Providers (APPs -  Physician Assistants and Nurse Practitioners) who all work together to provide you with the care you need, when you need it. You will need a follow up appointment in 6 months.  Please call our office 2 months in advance to schedule this appointment.  You may see Kathlyn Sacramento, MD or one of the following Advanced Practice Providers on your designated Care Team:   Murray Hodgkins, NP Christell Faith, PA-C . Marrianne Mood, PA-C  Any Other Special Instructions Will Be Listed Below (If Applicable).

## 2018-09-12 ENCOUNTER — Other Ambulatory Visit: Payer: Self-pay | Admitting: Cardiovascular Disease

## 2018-09-12 MED ORDER — CARVEDILOL 6.25 MG PO TABS: 6.2500 mg | ORAL_TABLET | Freq: Two times a day (BID) | ORAL | 1 refills | Status: DC

## 2018-09-12 NOTE — Telephone Encounter (Signed)
°*  STAT* If patient is at the pharmacy, call can be transferred to refill team.   1. Which medications need to be refilled? (please list name of each medication and dose if known) carvedilol 6.25 mg bid  2. Which pharmacy/location (including street and city if local pharmacy) is medication to be sent to? [facility]  3. Do they need a 30 day or 90 day supply? [place]

## 2018-09-12 NOTE — Telephone Encounter (Signed)
Requested Prescriptions   Signed Prescriptions Disp Refills  . carvedilol (COREG) 6.25 MG tablet 180 tablet 1    Sig: Take 1 tablet (6.25 mg total) by mouth 2 (two) times daily.    Authorizing Provider: Rise Mu    Ordering User: Raelene Bott, BRANDY L

## 2018-10-09 ENCOUNTER — Ambulatory Visit
Admission: RE | Admit: 2018-10-09 | Discharge: 2018-10-09 | Disposition: A | Discharge status: Home / Self Care | Payer: Medicare Other | Source: Ambulatory Visit | Attending: Specialist | Admitting: Specialist

## 2018-10-09 ENCOUNTER — Other Ambulatory Visit: Payer: Self-pay

## 2018-10-09 DIAGNOSIS — R918 Other nonspecific abnormal finding of lung field: Secondary | ICD-10-CM | POA: Insufficient documentation

## 2018-10-16 DIAGNOSIS — J439 Emphysema, unspecified: Secondary | ICD-10-CM | POA: Diagnosis not present

## 2018-10-16 DIAGNOSIS — R918 Other nonspecific abnormal finding of lung field: Secondary | ICD-10-CM | POA: Diagnosis not present

## 2018-11-29 DIAGNOSIS — I10 Essential (primary) hypertension: Secondary | ICD-10-CM | POA: Diagnosis not present

## 2018-11-29 DIAGNOSIS — E785 Hyperlipidemia, unspecified: Secondary | ICD-10-CM | POA: Diagnosis not present

## 2018-12-06 DIAGNOSIS — R911 Solitary pulmonary nodule: Secondary | ICD-10-CM | POA: Diagnosis not present

## 2018-12-06 DIAGNOSIS — Z87891 Personal history of nicotine dependence: Secondary | ICD-10-CM | POA: Diagnosis not present

## 2018-12-06 DIAGNOSIS — J9611 Chronic respiratory failure with hypoxia: Secondary | ICD-10-CM | POA: Diagnosis not present

## 2018-12-06 DIAGNOSIS — E785 Hyperlipidemia, unspecified: Secondary | ICD-10-CM | POA: Diagnosis not present

## 2018-12-06 DIAGNOSIS — I10 Essential (primary) hypertension: Secondary | ICD-10-CM | POA: Diagnosis not present

## 2018-12-06 DIAGNOSIS — F329 Major depressive disorder, single episode, unspecified: Secondary | ICD-10-CM | POA: Diagnosis not present

## 2018-12-06 DIAGNOSIS — Z23 Encounter for immunization: Secondary | ICD-10-CM | POA: Diagnosis not present

## 2019-04-10 NOTE — Progress Notes (Deleted)
Cardiology Office Note    Date:  04/10/2019   ID:  Anthony Johns, DOB [birth year], MRN [id number]  PCP:  Baxter Hire, MD  Cardiologist:  Kathlyn Sacramento, MD  Electrophysiologist:  None   Chief Complaint: Follow up  History of Present Illness:   Anthony Johns is a 86 y.o. male with history of CAD status post prior MI with multiple PCI's including BMS of the RCA in 2010, known CTO of the LCx, left carotid artery disease, venous insufficiency,  pulmonary nodules, HTN, HLD, and hiatal herniawho presents for follow-up of his CAD.   Patient has an extensive history of ischemic heart disease with prior MI at age 31.  He has had multiple PCIs including, most recently, BMS to the distal RCA secondary to 85% stenosis in 03/2008. He has a known CTO of the LCx with good collaterals and mild LAD disease from most recent cardiac cath on file dated 03/2008.  In the spring of 2019, he was diagnosed with pneumonia. Follow-up chest x-ray showed persistent opacities and a CT scan was performed in 05/2017 which showed scattered bilateral pulmonary nodules. Due to continued shortness of breath and lower extremity swelling he underwent echo on 07/13/2017 that showed an EF of 60 to 65%, no regional wall motion abnormalities, grade 2 diastolic dysfunction, mild MR, left atrium normal in size, RV systolic function normal, PASP normal. In 07/2017 he was admitted to the hospital with pneumonia.  Troponin was mildly elevated peaking at 0.21.  Repeat echo on 07/26/17 showed an EF of 60-65%, no RWMA, Gr2DD, mild MR, RVSF normal, PASP normal. His elevated troponin was felt to be supply demand ischemia in the setting of known CAD with an occluded LCx.  He underwent outpatient Lexiscan Myoview in 08/2017 which showed no evidence of ischemia with a normal EF.  He was seen by Dr. Fletcher Anon on 10/20/2017 and reported his chest pain was actually less than before with occasional jaw discomfort.  This pattern was noted to be stable.  His  shortness of breath also seem to be improving.  Follow-up CT chest on 10/19/2017 to evaluate his recent pneumonia and pulmonary nodules showed stable bilateral pulmonary nodules with no evidence of infection.  Most recent carotid artery ultrasound from 07/30/2018 showed 1 to 39% right internal carotid artery stenosis, 40 to 59% left internal carotid artery stenosis (prior 60 to 79% stenosis in 06/2017), bilateral vertebral arteries with antegrade flow with stenotic right subclavian artery and disturbed flow along the left subclavian artery.   He was most recently seen in the office in 08/2018 and was doing well from a cardiac perspective.  He had self tapered himself off supplemental oxygen and was on room air.  He reported his breathing was at his approximate baseline.  He continued to note intermittent brief episodes of chest pain lasting for several seconds at a time that were similar to hi presentation in 09/2017.  He was consuming a diet high in sodium with prepared foods.  Follow-up chest CT, ordered by pulmonology, in 09/2018 showed a stable pulmonary nodules in the bilateral lungs with no acute cardiopulmonary abnormalities and noted coronary artery calcifications and aortic atherosclerosis.  ***   Labs independently reviewed: 11/2018 - Hgb 12.8, PLT 159, potassium 4.9, BUN 18, serum creatinine 1.2, AST/ALT normal, albumin 4.5, TC 179, TG 134, HDL 68, LDL 84 10/2016 - TSH normal  Past Medical History:  Diagnosis Date  . (HFpEF) heart failure with preserved ejection fraction ([place])  a. 06/2017 Echo: EF 60-65%, no rwma, Gr2 DD, mild MR, nl LA size, nl RV fxn, PASP wnl.  . Coronary artery disease    a. He reports having myocardial infarction at the age of 75. He reports having multiple PCI's; b. 03/2008 Cath/PCI: LAD mild dzs, LCX 19m, RCA 85d (3.0x18 Vision BMS); c. 07/2016 MV: EF 55-65%, mid inf/inflat infarct w/o ischemia.  . Essential hypertension   . Hiatal hernia   . Hypercalcemia   .  Hyperlipidemia   . Hypertension   . Ischemic heart disease   . MI (myocardial infarction) [facility])     Past Surgical History:  Procedure Laterality Date  . CARDIAC CATHETERIZATION     [facility]  . CORONARY ANGIOPLASTY WITH STENT PLACEMENT      Current Medications: No outpatient medications have been marked as taking for the 04/15/19 encounter (Appointment) with Rise Mu, PA-C.    Allergies:   Tape   Social History   Socioeconomic History  . Marital status: Widowed    Spouse name: Not on file  . Number of children: Not on file  . Years of education: Not on file  . Highest education level: Not on file  Occupational History  . Not on file  Tobacco Use  . Smoking status: Former Smoker    Types: Cigarettes    Quit date: 03/25/1982    Years since quitting: 37.0  . Smokeless tobacco: Never Used  Substance and Sexual Activity  . Alcohol use: Yes    Comment: occas.   . Drug use: No  . Sexual activity: Not on file  Other Topics Concern  . Not on file  Social History Narrative  . Not on file   Social Determinants of Health   Financial Resource Strain:   . Difficulty of Paying Living Expenses: Not on file  Food Insecurity:   . Worried About Charity fundraiser in the Last Year: Not on file  . Ran Out of Food in the Last Year: Not on file  Transportation Needs:   . Lack of Transportation (Medical): Not on file  . Lack of Transportation (Non-Medical): Not on file  Physical Activity:   . Days of Exercise per Week: Not on file  . Minutes of Exercise per Session: Not on file  Stress:   . Feeling of Stress : Not on file  Social Connections:   . Frequency of Communication with Friends and Family: Not on file  . Frequency of Social Gatherings with Friends and Family: Not on file  . Attends Religious Services: Not on file  . Active Member of Clubs or Organizations: Not on file  . Attends Archivist Meetings: Not on file  . Marital Status: Not on file     Family  History:  The patient's family history includes Hypertension in his father.  ROS:   ROS   EKGs/Labs/Other Studies Reviewed:    Studies reviewed were summarized above. The additional studies were reviewed today:  2D echo 07/2017: - Procedure narrative: Transthoracic echocardiography. The study  was technically difficult.  - Left ventricle: The cavity size was normal. Systolic function was  normal. The estimated ejection fraction was in the range of 60%  to 65%. Wall motion was normal; there were no regional wall  motion abnormalities. Features are consistent with a pseudonormal  left ventricular filling pattern, with concomitant abnormal  relaxation and increased filling pressure (grade 2 diastolic  dysfunction).  - Mitral valve: There was mild regurgitation.  - Right  ventricle: Systolic function was normal.  - Pulmonary arteries: Systolic pressure was within the normal  range.  __________  Carlton Adam MPI 08/2017:  There was no ST segment deviation noted during stress.  No T wave inversion was noted during stress.  The study is normal.  This is a low risk study.  The left ventricular ejection fraction is normal (55-65%).   EKG:  EKG is ordered today.  The EKG ordered today demonstrates ***  Recent Labs: No results found for requested labs within last 8760 hours.  Recent Lipid Panel No results found for: CHOL, TRIG, HDL, CHOLHDL, VLDL, LDLCALC, LDLDIRECT  PHYSICAL EXAM:    VS:  There were no vitals taken for this visit.  BMI: There is no height or weight on file to calculate BMI.  Physical Exam  Wt Readings from Last 3 Encounters:  09/04/18 181 lb 12 oz (82.4 kg)  10/20/17 178 lb (80.7 kg)  09/05/17 180 lb 8 oz (81.9 kg)     ASSESSMENT & PLAN:   1. ***  Disposition: F/u with Dr. Fletcher Anon or an APP in ***.   Medication Adjustments/Labs and Tests Ordered: Current medicines are reviewed at length with the patient today.  Concerns regarding  medicines are outlined above. Medication changes, Labs and Tests ordered today are summarized above and listed in the Patient Instructions accessible in Encounters.   Signed, Christell Faith, PA-C 04/10/2019 12:03 PM     [place] [address], [place] [address] [phone]

## 2019-04-11 ENCOUNTER — Ambulatory Visit: Payer: Medicare Other | Admitting: Nurse Practitioner

## 2019-04-15 ENCOUNTER — Encounter: Payer: Self-pay | Admitting: Physician Assistant

## 2019-04-15 ENCOUNTER — Other Ambulatory Visit: Payer: Self-pay

## 2019-04-15 ENCOUNTER — Ambulatory Visit (INDEPENDENT_AMBULATORY_CARE_PROVIDER_SITE_OTHER): Payer: Medicare Other | Admitting: Family Medicine

## 2019-04-15 VITALS — BP 140/72 | HR 77 | Ht 66.0 in | Wt 187.5 lb

## 2019-04-15 DIAGNOSIS — I10 Essential (primary) hypertension: Secondary | ICD-10-CM | POA: Diagnosis not present

## 2019-04-15 DIAGNOSIS — I6523 Occlusion and stenosis of bilateral carotid arteries: Secondary | ICD-10-CM | POA: Diagnosis not present

## 2019-04-15 DIAGNOSIS — J449 Chronic obstructive pulmonary disease, unspecified: Secondary | ICD-10-CM

## 2019-04-15 DIAGNOSIS — R600 Localized edema: Secondary | ICD-10-CM

## 2019-04-15 DIAGNOSIS — I25118 Atherosclerotic heart disease of native coronary artery with other forms of angina pectoris: Secondary | ICD-10-CM

## 2019-04-15 DIAGNOSIS — E785 Hyperlipidemia, unspecified: Secondary | ICD-10-CM

## 2019-04-15 NOTE — Patient Instructions (Signed)
Medication Instructions:  Your physician recommends that you continue on your current medications as directed. Please refer to the Current Medication list given to you today.  *If you need a refill on your cardiac medications before your next appointment, please call your pharmacy*  Lab Work: NONE If you have labs (blood work) drawn today and your tests are completely normal, you will receive your results only by: Marland Kitchen MyChart Message (if you have MyChart) OR . A paper copy in the mail If you have any lab test that is abnormal or we need to change your treatment, we will call you to review the results.  Testing/Procedures: NONE  Follow-Up: At [facility], you and your health needs are our priority.  As part of our continuing mission to provide you with exceptional heart care, we have created designated Provider Care Teams.  These Care Teams include your primary Cardiologist (physician) and Advanced Practice Providers (APPs -  Physician Assistants and Nurse Practitioners) who all work together to provide you with the care you need, when you need it.  Your next appointment:   6 month(s)  The format for your next appointment:   In Person  Provider:    You may see Kathlyn Sacramento, MD or one of the following Advanced Practice Providers on your designated Care Team:    Murray Hodgkins, NP  Christell Faith, PA-C  Marrianne Mood, PA-C

## 2019-04-15 NOTE — Progress Notes (Addendum)
Cardiology Office Note  Date: 04/15/2019   ID: SAJID KNEIFL, DOB 18-Mar-1927, MRN [id number]  PCP:  Baxter Hire, MD  Cardiologist:  Kathlyn Sacramento, MD Electrophysiologist:  None   Chief Complaint  Patient presents with  . Follow-up    CAD, CAS, HTN, HLD    History of Present Illness: Anthony Johns is a 86 y.o. male with a history of CAD s/p MI age 34.  Multiple PCI's with BMS of RCA in 2010.  CTO of LCx, left carotid artery stenosis, venous insufficiency, pulmonary nodules, HTN, HLD, hiatal hernia, CAS, Hx of smoking 2 ppd x 32 years (64 pack years). ETOH 2 drinks per night.  Echocardiogram performed in 2019 secondary to continued shortness of breath and lower with pneumonia admission and mild elevated troponin, lower extremity edema; EF 60 to 65%, grade 2 DD, mild MR, no R WMA, grade 2 DD, mild MR. Elevated troponin thought to be supply demand mismatch in setting of known CAD with chronic total occlusion of left circumflex. Subsequent Myoview July 2019 = no ischemia and normal EF.  Carotid artery ultrasound 123456 RICA 123456,  LICA 123456 Vertebral antegrade flow bilaterally. Right subclavian artery was stenotic, left subclavian artery flow was disturbed  Last seen by Anthony Faith, PA September 04, 2018.  During that visit he had tapered his supplemental O2 and was having intermittent brief episodes of chest pain several seconds at a time similar to previous presentation  August 2019.  He had stated during that visit he was "doing good" for a man his age.  He sees pulmonology for history of pulmonary nodules and was scheduled for follow-up CT after that visit. CT 10/09/2018 Stable pulmonary nodules with aortic atherosclerosis, coronary artery calcifications.  His lower extremity edema was stable.  Patient states he has not been very active on a daily basis.  States he becomes short of breath when walking to his mailbox and back to the house.  He states his chest comes slightly tight  when this occurs but when walking around the house he has no significant dyspnea on exertion or chest tightness.  He states sometimes at night he wakes up in a sweat but otherwise no other issues.  He continues to complain of bilateral lower extremity edema.  He states he has not been taking his Lasix.  He seems not to recognize the medication when pointed out on his medication list.  He does have significant venous insufficiency with obvious varicosities worse in left leg than right.  He has at least 1+ pitting edema in that leg but less so in the right.  Patient states his batteries in his home scales do not work and needs to get replacement batteries.  He has not been weighing himself for a while at last visit he had tapered his oxygen.  He brings with him today an oxygen concentrator and wears it mostly at night and on occasions when requiring more exertional effort.  Past Medical History:  Diagnosis Date  . (HFpEF) heart failure with preserved ejection fraction ([place])    a. 06/2017 Echo: EF 60-65%, no rwma, Gr2 DD, mild MR, nl LA size, nl RV fxn, PASP wnl.  . Coronary artery disease    a. He reports having myocardial infarction at the age of 60. He reports having multiple PCI's; b. 03/2008 Cath/PCI: LAD mild dzs, LCX 154m, RCA 85d (3.0x18 Vision BMS); c. 07/2016 MV: EF 55-65%, mid inf/inflat infarct w/o ischemia.  . Essential hypertension   .  Hiatal hernia   . Hypercalcemia   . Hyperlipidemia   . Hypertension   . Ischemic heart disease   . MI (myocardial infarction) [facility])     Past Surgical History:  Procedure Laterality Date  . CARDIAC CATHETERIZATION     [facility]  . CORONARY ANGIOPLASTY WITH STENT PLACEMENT      Current Outpatient Medications  Medication Sig Dispense Refill  . amLODipine (NORVASC) 10 MG tablet Take 10 mg by mouth daily.     Marland Kitchen aspirin EC 81 MG tablet Take 81 mg by mouth daily.     . carvedilol (COREG) 6.25 MG tablet Take 1 tablet (6.25 mg total) by mouth 2 (two) times daily.  180 tablet 1  . furosemide (LASIX) 20 MG tablet Take 1 tablet (20 mg total) by mouth daily. 30 tablet 3  . guaiFENesin-dextromethorphan (ROBITUSSIN DM) 100-10 MG/5ML syrup Take 5 mLs by mouth every 6 (six) hours as needed for cough. 118 mL 0  . losartan (COZAAR) 100 MG tablet Take 100 mg by mouth daily.     . magnesium oxide (MAG-OX) 400 MG tablet Take 400 mg by mouth daily.     . nitroGLYCERIN (NITROSTAT) 0.4 MG SL tablet Place 0.4 mg under the tongue every 5 (five) minutes as needed.     Marland Kitchen omeprazole (PRILOSEC) 20 MG capsule TAKE 1 CAPSULE DAILY    . sertraline (ZOLOFT) 25 MG tablet Take 25 mg by mouth daily.    . simvastatin (ZOCOR) 20 MG tablet Take 20 mg by mouth daily at 6 PM.      No current facility-administered medications for this visit.   Allergies:  Tape   Social History: The patient  reports that he quit smoking about 37 years ago. His smoking use included cigarettes. He has never used smokeless tobacco. He reports current alcohol use. He reports that he does not use drugs.   Family History: The patient's family history includes Hypertension in his father.   ROS:  Please see the history of present illness. Otherwise, complete review of systems is positive for none.  All other systems are reviewed and negative.   Physical Exam: VS:  BP (!) 150/64 (BP Location: Left Arm, Patient Position: Sitting, Cuff Size: Normal)   Pulse 77   Ht 5\' 6"  (1.676 m)   Wt 187 lb 8 oz (85 kg)   SpO2 95%   BMI 30.26 kg/m , BMI Body mass index is 30.26 kg/m.  Wt Readings from Last 3 Encounters:  04/15/19 187 lb 8 oz (85 kg)  09/04/18 181 lb 12 oz (82.4 kg)  10/20/17 178 lb (80.7 kg)    General: Patient appears comfortable at rest. Neck: Supple, no elevated JVP or carotid bruits, no thyromegaly. Lungs: Clear to auscultation, nonlabored breathing at rest. Cardiac: Regular rate and rhythm, no S3 or significant systolic murmur, no pericardial rub. Abdomen: Soft, nontender, no hepatomegaly,  bowel sounds present, no guarding or rebound. Extremities: No pitting edema, distal pulses 2+. Skin: Warm and dry. Musculoskeletal: No kyphosis. Neuropsychiatric: Alert and oriented x3, affect grossly appropriate.  ECG:  An ECG dated 04/15/2019 was personally reviewed today and demonstrated:  NSR ith Freq PVC's Septal infarct age undetermined, St / T wave abnormalities  Recent Labwork: No results found for requested labs within last 8760 hours.  No results found for: Anthony, TRIG, HDL, CHOLHDL, VLDL, LDLCALC, LDLDIRECT  Other Studies Reviewed Today:  Echocardiogram 07/26/2017 Study Conclusions  - Procedure narrative: Transthoracic echocardiography. The study  was technically difficult.  -  Left ventricle: The cavity size was normal. Systolic function was  normal. The estimated ejection fraction was in the range of 60%  to 65%. Wall motion was normal; there were no regional wall  motion abnormalities. Features are consistent with a pseudonormal  left ventricular filling pattern, with concomitant abnormal  relaxation and increased filling pressure (grade 2 diastolic  dysfunction).  - Mitral valve: There was mild regurgitation.  - Right ventricle: Systolic function was normal.  - Pulmonary arteries: Systolic pressure was within the normal  range.    Carotid artery duplex study 07/30/2018 Summary:  Right Carotid: Velocities in the right ICA are consistent with a 1-39%  stenosis.  Non-hemodynamically significant plaque <50% noted in the  CCA. The ECA appears >50% stenosed.  Left Carotid: Velocities in the left ICA are consistent with a 40-59%  stenosis. The ECA appears <50% stenosed.  Vertebrals: Bilateral vertebral arteries demonstrate antegrade flow.  Subclavians: Right subclavian artery was stenotic. Left subclavian artery  flow was disturbed.   Assessment and Plan:  1. Coronary artery disease of native artery of native heart with stable angina pectoris ([place])   2.  Carotid stenosis, bilateral   3. Chronic obstructive pulmonary disease, unspecified COPD type ([place])   4. Essential hypertension   5. Hyperlipidemia LDL goal <70   6. Bilateral lower extremity edema    1. Coronary artery disease of native artery of native heart with stable angina pectoris ([facility]) Status post MI age 77.  Multiple PCI's with BMS to RCA 2010.  Has chronic mild intermittent angina, stable. Continue ASA 81 mg daily, Coreg 6.25 mg po bid, SL NTG prn, and statin.   2. Carotid stenosis, bilateral Recent carotid artery duplex study 07/30/2018 showing 40 to 59% left ICA stenosis, 1 to 39% right ICA stenosis. It appears repeat Carotid study was ordered for 1 year follow up on 07/31/2018 for June of 2021.  3. Chronic obstructive pulmonary disease, unspecified COPD type ([place]) Previous history of smoking.  Quit 36 years ago. Smoked (2 pdd x 32 years). Weaned himself off home 02 on at last visit. Has portable 02 with him today PRN. CT Scan scheduled for this year to assess pulmonary nodules.  4. Essential hypertension Blood pressure today on arrival 150/64. Recheck in left arm after sitting 140/72. Continue Amlodipine 10 mg daily, Carvedilol 6.25 mg po bid, Continue losartan 100 daily.   5. Hyperlipidemia LDL goal <70 Simvastatin 20 mg daily. LDL  84 on 11/29/2018. Continue current dosage.  6. LE edema bilateral Bilateral LE edema. 1+ pitting. Patient states he is not taking Lasix anymore. Based on current records Lasix has not been discontinued. Had CMA call the patient and confirm with him that he is still ordered to take Lasix. Patient stated to that he still has some at home. Continue Lasix as ordered.  Medication Adjustments/Labs and Tests Ordered: Current medicines are reviewed at length with the patient today.  Concerns regarding medicines are outlined above.   Disposition follow-up with Dr. Fletcher Anon or an APP in 6 months  Signed, Levell July, NP 04/15/2019 2:13 PM    [place]

## 2019-04-22 ENCOUNTER — Ambulatory Visit: Payer: Medicare Other | Attending: Internal Medicine

## 2019-04-22 DIAGNOSIS — U071 COVID-19: Secondary | ICD-10-CM

## 2019-04-23 LAB — SPECIMEN STATUS REPORT

## 2019-04-23 LAB — NOVEL CORONAVIRUS, NAA: SARS-CoV-2, NAA: DETECTED — AB

## 2019-04-24 ENCOUNTER — Telehealth: Payer: Self-pay | Admitting: Physician Assistant

## 2019-04-24 NOTE — Telephone Encounter (Signed)
Called to discuss with Anthony Johns about Covid symptoms and the use of bamlanivimab or casirivimab/imdevimab, a monoclonal antibody infusion for those with mild to moderate Covid symptoms and at a high risk of hospitalization.     Pt is qualified for this infusion at the [facility] infusion center due to co-morbid conditions and/or a member of an at-risk group, however declines infusion at this time. He says he is feeling fine. Symptoms tier reviewed as well as criteria for ending isolation.  Symptoms reviewed that would warrant ED/Hospital evaluation. Preventative practices reviewed. Patient verbalized understanding. Patient advised to call back if he decides that he does want to get infusion. Callback number to the infusion center given. Patient advised to go to Urgent care or ED with severe symptoms. Last date pt would be eligible for infusion is 04/28/19.    Patient Active Problem List   Diagnosis Date Noted  . CAP (community acquired pneumonia) 07/25/2017  . Coronary artery disease involving native coronary artery with angina pectoris ([place]) 11/13/2014  . Right carotid bruit 11/13/2014  . Hyperlipidemia   . Essential hypertension     Angelena Form PA-C

## 2019-10-15 ENCOUNTER — Other Ambulatory Visit: Payer: Self-pay | Admitting: Specialist

## 2019-10-15 DIAGNOSIS — R918 Other nonspecific abnormal finding of lung field: Secondary | ICD-10-CM

## 2019-11-14 ENCOUNTER — Ambulatory Visit
Admission: RE | Admit: 2019-11-14 | Discharge: 2019-11-14 | Disposition: A | Discharge status: Home / Self Care | Payer: Medicare Other | Source: Ambulatory Visit | Attending: Specialist | Admitting: Specialist

## 2019-11-14 ENCOUNTER — Other Ambulatory Visit: Payer: Self-pay

## 2019-11-14 DIAGNOSIS — R918 Other nonspecific abnormal finding of lung field: Secondary | ICD-10-CM | POA: Insufficient documentation

## 2019-12-19 NOTE — Progress Notes (Signed)
Office Visit    Patient Name: Anthony Johns Date of Encounter: 12/20/2019  Primary Care Provider:  Baxter Hire, MD Primary Cardiologist:  Kathlyn Sacramento, MD Electrophysiologist:  None   Chief Complaint    Anthony Johns is a 86 y.o. male with a hx of CAD s/p prior MI with multiple PCI's including BMS of RCA in 2010, known CTO of LCx, COPD, bilateral carotid artery disease, diastolic dysfunction, venous insufficiency, HTN, HLD, hiatal hernia presents today for follow-up of CAD  Past Medical History    Past Medical History:  Diagnosis Date  . (HFpEF) heart failure with preserved ejection fraction ([place])    a. 06/2017 Echo: EF 60-65%, no rwma, Gr2 DD, mild MR, nl LA size, nl RV fxn, PASP wnl.  . Coronary artery disease    a. He reports having myocardial infarction at the age of 62. He reports having multiple PCI's; b. 03/2008 Cath/PCI: LAD mild dzs, LCX 127m, RCA 85d (3.0x18 Vision BMS); c. 07/2016 MV: EF 55-65%, mid inf/inflat infarct w/o ischemia.  . Essential hypertension   . Hiatal hernia   . Hypercalcemia   . Hyperlipidemia   . Hypertension   . Ischemic heart disease   . MI (myocardial infarction) [facility])    Past Surgical History:  Procedure Laterality Date  . CARDIAC CATHETERIZATION     [facility]  . CORONARY ANGIOPLASTY WITH STENT PLACEMENT      Allergies  Allergies  Allergen Reactions  . Tape     History of Present Illness    Anthony Johns is a 86 y.o. male with a hx of  CAD s/p prior MI with multiple PCI's including BMS of RCA in 2010, known CTO of LCx, COPD, bilateral carotid artery disease, diastolic dysfunction, venous insufficiency, HTN, HLD, hiatal hernia  last seen 04/15/2019 by Katina Dung, NP.  Extensive history of ischemic heart disease with prior MI at age 57.  Multiple PCI's performed with most recent BMS to the distal RCA secondary to 85% stenosis 03/2008.  Known CTO of LCx with good collaterals and mid LAD disease from cardiac cath on file 03/2008.   Spring 2019 diagnosed with pneumonia.  Follow-up chest x-ray with persistent opacities on CT scan 05/2017 scattered bilateral pulmonary nodules.  Echo 5/23 719 due to shortness of breath and lower extremity edema with EF 66 5%, noRWMA, gr2dd, mild MR, left atrium normal, RV SF normal, PASP normal.  June 2019 he was admitted to the hospital with pneumonia.  Troponin mildly elevated peaking at 0.21.  Repeat echo 07/26/2017 EF 60 to 65%, no RWMA, gr2DD, mild MR, RV SF normal, PSAP normal.  Elevated troponin presumed due to demand ischemia.  Lexiscan Myoview 08/2017 with no evidence of ischemia and normal EF.  CT chest 10/19/2017 for evaluation of pneumonia and pulmonary nodules showed stable bilateral pulmonary nodules with no evidence of infection.  Carotid artery ultrasound 07/30/2018 with 1-39% right ICA stenosis, 40-29% left ICA stenosis (prior 60 to 79% stenosis 06/2017), bilateral vertebral arteries with antegrade flow with a stenotic right subclavian artery and disturbed flow along left subclavian artery.  He was last seen by Katina Dung, NP 04/15/2019.  He noted bilateral lower extremity edema but was not taking his Lasix.  It was recommended to resume.  Labs via care everywhere 12/13/2019  K4.6, NA 139, GFR 63, creatinine 1.1, hemoglobin 12, WBC 6.9, RBC 4.3, HCT 38.6  Labs via care everywhere 11/29/2018  Total cholesterol 179, triglycerides 134, HDL 68.5, LDL 84.  Very  pleasant gentleman who reports today for follow-up.  Tells me he enjoys spending time with his children and grandchildren.  He reports feeling overall well.  He reports his lower extremity edema is labile and stable at his baseline. Not taking Lasix at this time. He tells me he does not like the urinary frequency it causes. He does not wear compression stockings.  We discussed keeping his feet elevated.  He reports no shortness of breath at rest.  Tells me dyspnea on exertion is stable at his baseline.  He uses oxygen periodically though has  been advised by his pulmonologist to use it at all times.  His oxygen saturation on room air today was 93%.  BP mildly elevated in clinic, does not routinely check at home.  EKG today shows occasional PVC, he reports intermittent palpitations which are not associate with pain or shortness of breath and overall not bothersome.  He reports occasional episodes of chest pain if he does more than he is used to.  This resolved with 1 nitroglycerin tablet.  Tells me he takes 1 tablet of nitroglycerin a few times per month with good resolution in symptoms.  EKGs/Labs/Other Studies Reviewed:   The following studies were reviewed today: Echocardiogram 07/26/2017 Study Conclusions  - Procedure narrative: Transthoracic echocardiography. The study    was technically difficult.  - Left ventricle: The cavity size was normal. Systolic function was    normal. The estimated ejection fraction was in the range of 60%    to 65%. Wall motion was normal; there were no regional wall    motion abnormalities. Features are consistent with a pseudonormal    left ventricular filling pattern, with concomitant abnormal    relaxation and increased filling pressure (grade 2 diastolic    dysfunction).  - Mitral valve: There was mild regurgitation.  - Right ventricle: Systolic function was normal.  - Pulmonary arteries: Systolic pressure was within the normal    range.     Carotid artery duplex study 07/30/2018 Summary:  Right Carotid: Velocities in the right ICA are consistent with a 1-39%  stenosis.  Non-hemodynamically significant plaque <50% noted in the  CCA. The ECA appears >50% stenosed.  Left Carotid: Velocities in the left ICA are consistent with a 40-59%  stenosis. The ECA appears <50% stenosed.  Vertebrals:  Bilateral vertebral arteries demonstrate antegrade flow.  Subclavians: Right subclavian artery was stenotic. Left subclavian artery  flow was disturbed.   EKG:  EKG is ordered today.  The ekg ordered today  demonstrates sinus rhythm 77 bpm, LVH with occasional PVC and poor R wave progression.  Recent Labs: No results found for requested labs within last 8760 hours.  Recent Lipid Panel No results found for: CHOL, TRIG, HDL, CHOLHDL, VLDL, LDLCALC, LDLDIRECT  Home Medications   Current Meds  Medication Sig  . amLODipine (NORVASC) 10 MG tablet Take 10 mg by mouth daily.   Marland Kitchen aspirin EC 81 MG tablet Take 81 mg by mouth daily.   . carvedilol (COREG) 6.25 MG tablet Take 1 tablet (6.25 mg total) by mouth 2 (two) times daily.  . cyanocobalamin 1000 MCG tablet Take by mouth.  . losartan (COZAAR) 100 MG tablet Take 100 mg by mouth daily.   . magnesium oxide (MAG-OX) 400 MG tablet Take 400 mg by mouth daily.   . nitroGLYCERIN (NITROSTAT) 0.4 MG SL tablet Place 0.4 mg under the tongue every 5 (five) minutes as needed.   Marland Kitchen omeprazole (PRILOSEC) 20 MG capsule TAKE 1 CAPSULE DAILY  .  sertraline (ZOLOFT) 25 MG tablet Take 25 mg by mouth daily.  . simvastatin (ZOCOR) 20 MG tablet Take 20 mg by mouth daily at 6 PM.     Review of Systems  All other systems reviewed and are otherwise negative except as noted above.  Physical Exam    VS:  BP 140/70 (BP Location: Left Arm, Patient Position: Sitting, Cuff Size: Normal)   Pulse 77   Ht 5\' 6"  (1.676 m)   Wt 184 lb (83.5 kg)   SpO2 92%   BMI 29.70 kg/m  , BMI Body mass index is 29.7 kg/m.  Wt Readings from Last 3 Encounters:  12/20/19 184 lb (83.5 kg)  04/15/19 187 lb 8 oz (85 kg)  09/04/18 181 lb 12 oz (82.4 kg)    GEN: Well nourished, well developed, in no acute distress. HEENT: normal. Neck: Supple, no JVD, carotid bruits, or masses. Cardiac: RRR, no murmurs, rubs, or gallops. No clubbing, cyanosis. Bilateral 1+ pitting edema.  Radials/DP/PT 2+ and equal bilaterally.  Respiratory:  Respirations regular and unlabored, clear to auscultation bilaterally. GI: Soft, nontender, nondistended. MS: No deformity or atrophy. Skin: Warm and dry, no  rash. Neuro:  Strength and sensation are intact. Psych: Normal affect.  Assessment & Plan    1. CAD- He reports stable anginal symptoms which are relieved by occasional single tablet of nitroglycerin.  Continue low-salt, heart healthy diet.  Continue regular cardiovascular exercise.  EKG today without acute ST/T wave changes.  GDMT includes aspirin, Coreg, simvastatin. Start Imdur 15 mg daily for improvement in BP as well as stable anginal symptoms.  No indication for ischemic evaluation at this time.  2. PVC -occasional PVC noted by EKG today.  He reports occasional palpitation though not associated with chest pain or shortness of breath.  We discussed possibly increasing his carvedilol dose though he prefers to defer at this time and will contact us if his palpitations become more bothersome.  3. Carotid artery stenosis- Duplex 07/2018 with 1-39% RICA stenosis and 57-50% LICA stenosis. Stable compared to last year. Update carotid duplex for monitoring. Continue aspirin and statin.   4. COPD / Lung nodules - Follows with Dr. Raul Del of pulmonology.   5. HTN- BP elevated today.  Add Imdur, as above.  Continue amlodipine 10 mg daily, Coreg 6.25 mg twice daily, losartan 100 mg daily.  6. HLD- Lipid panel 11/2018 with LDL 84. Continue Simvastatin 20mg  daily per primary care.   7. Lower extremity edema- Stable at his baseline. Not taking Lasix, politely declines as he does not like the urinary frequency.  Encouraged to elevate lower extremities when sitting edema is likely dependent.  Disposition: Follow up in 6 month(s) with Dr. Fletcher Anon or APP   Signed, Loel Dubonnet, NP 12/20/2019, 10:26 AM [place]

## 2019-12-20 ENCOUNTER — Other Ambulatory Visit: Payer: Self-pay

## 2019-12-20 ENCOUNTER — Encounter: Payer: Self-pay | Admitting: Family

## 2019-12-20 ENCOUNTER — Ambulatory Visit (INDEPENDENT_AMBULATORY_CARE_PROVIDER_SITE_OTHER): Payer: Medicare Other | Admitting: Family

## 2019-12-20 VITALS — BP 140/70 | HR 77 | Ht 66.0 in | Wt 184.0 lb

## 2019-12-20 DIAGNOSIS — E782 Mixed hyperlipidemia: Secondary | ICD-10-CM

## 2019-12-20 DIAGNOSIS — I6523 Occlusion and stenosis of bilateral carotid arteries: Secondary | ICD-10-CM

## 2019-12-20 DIAGNOSIS — I25118 Atherosclerotic heart disease of native coronary artery with other forms of angina pectoris: Secondary | ICD-10-CM

## 2019-12-20 DIAGNOSIS — I10 Essential (primary) hypertension: Secondary | ICD-10-CM | POA: Diagnosis not present

## 2019-12-20 MED ORDER — ISOSORBIDE MONONITRATE ER 30 MG PO TB24: 15.0000 mg | ORAL_TABLET | Freq: Every day | ORAL | 3 refills | Status: DC

## 2019-12-20 NOTE — Patient Instructions (Addendum)
Medication Instructions:  Your physician has recommended you make the following change in your medication:   START Isosorbide Mononitrate (Imdur) 15mg  (half tablet) once daily. *This will help keep your blood pressure well controlled and also help prevent you from needing the nitroglycerin quite as often*  If you notice more of the fast or early heart beats, called PVCs, which we saw on the EKG - we can consider increasing your Carvedilol. Simply call our office to let us know.  *If you need a refill on your cardiac medications before your next appointment, please call your pharmacy*  Lab Work: No lab work today  Testing/Procedures: Your physician has requested that you have a carotid duplex. This test is an ultrasound of the carotid arteries in your neck. It looks at blood flow through these arteries that supply the brain with blood. Allow one hour for this exam. There are no restrictions or special instructions.   Follow-Up: At [facility], you and your health needs are our priority.  As part of our continuing mission to provide you with exceptional heart care, we have created designated Provider Care Teams.  These Care Teams include your primary Cardiologist (physician) and Advanced Practice Providers (APPs -  Physician Assistants and Nurse Practitioners) who all work together to provide you with the care you need, when you need it.  We recommend signing up for the patient portal called "MyChart".  Sign up information is provided on this After Visit Summary.  MyChart is used to connect with patients for Virtual Visits (Telemedicine).  Patients are able to view lab/test results, encounter notes, upcoming appointments, etc.  Non-urgent messages can be sent to your provider as well.   To learn more about what you can do with MyChart, go to [email or web address].    Your next appointment:   6 month(s)  The format for your next appointment:   In Person  Provider:   You may see  Kathlyn Sacramento, MD or one of the following Advanced Practice Providers on your designated Care Team:    Murray Hodgkins, NP  Christell Faith, PA-C  Marrianne Mood, PA-C  Laurann Montana, NP  Cadence Kathlen Mody, [place]  Other Instructions   Keep your feet up when sitting to prevent swelling.   Recommend a low salt diet.   Isosorbide Mononitrate extended-release tablets What is this medicine? ISOSORBIDE MONONITRATE (eye soe SOR bide mon oh NYE trate) is a vasodilator. It relaxes blood vessels, increasing the blood and oxygen supply to your heart. This medicine is used to prevent chest pain caused by angina. It will not help to stop an episode of chest pain. This medicine may be used for other purposes; ask your health care provider or pharmacist if you have questions. COMMON BRAND NAME(S): Imdur, Isotrate ER What should I tell my health care provider before I take this medicine? They need to know if you have any of these conditions:  previous heart attack or heart failure  an unusual or allergic reaction to isosorbide mononitrate, nitrates, other medicines, foods, dyes, or preservatives  pregnant or trying to get pregnant  breast-feeding How should I use this medicine? Take this medicine by mouth with a glass of water. Follow the directions on the prescription label. Do not crush or chew. Take your medicine at regular intervals. Do not take your medicine more often than directed. Do not stop taking this medicine except on the advice of your doctor or health care professional. Talk to your pediatrician regarding the use of this medicine  in children. Special care may be needed. Overdosage: If you think you have taken too much of this medicine contact a poison control center or emergency room at once. NOTE: This medicine is only for you. Do not share this medicine with others. What if I miss a dose? If you miss a dose, take it as soon as you can. If it is almost time for your next dose, take  only that dose. Do not take double or extra doses. What may interact with this medicine? Do not take this medicine with any of the following medications:  medicines used to treat erectile dysfunction (ED) like avanafil, sildenafil, tadalafil, and vardenafil  riociguat This medicine may also interact with the following medications:  medicines for high blood pressure  other medicines for angina or heart failure This list may not describe all possible interactions. Give your health care provider a list of all the medicines, herbs, non-prescription drugs, or dietary supplements you use. Also tell them if you smoke, drink alcohol, or use illegal drugs. Some items may interact with your medicine. What should I watch for while using this medicine? Check your heart rate and blood pressure regularly while you are taking this medicine. Ask your doctor or health care professional what your heart rate and blood pressure should be and when you should contact him or her. Tell your doctor or health care professional if you feel your medicine is no longer working. You may get dizzy. Do not drive, use machinery, or do anything that needs mental alertness until you know how this medicine affects you. To reduce the risk of dizzy or fainting spells, do not sit or stand up quickly, especially if you are an older patient. Alcohol can make you more dizzy, and increase flushing and rapid heartbeats. Avoid alcoholic drinks. Do not treat yourself for coughs, colds, or pain while you are taking this medicine without asking your doctor or health care professional for advice. Some ingredients may increase your blood pressure. What side effects may I notice from receiving this medicine? Side effects that you should report to your doctor or health care professional as soon as possible:  bluish discoloration of lips, fingernails, or palms of hands  irregular heartbeat, palpitations  low blood pressure  nausea,  vomiting  persistent headache  unusually weak or tired Side effects that usually do not require medical attention (report to your doctor or health care professional if they continue or are bothersome):  flushing of the face or neck  rash This list may not describe all possible side effects. Call your doctor for medical advice about side effects. You may report side effects to FDA at 1-800-FDA-1088. Where should I keep my medicine? Keep out of the reach of children. Store between 15 and 30 degrees C (59 and 86 degrees F). Keep container tightly closed. Throw away any unused medicine after the expiration date. NOTE: This sheet is a summary. It may not cover all possible information. If you have questions about this medicine, talk to your doctor, pharmacist, or health care provider.  2020 Elsevier/Gold Standard (2012-12-07 14:48:19)

## 2019-12-23 ENCOUNTER — Other Ambulatory Visit: Payer: Self-pay | Admitting: Family

## 2019-12-23 DIAGNOSIS — I10 Essential (primary) hypertension: Secondary | ICD-10-CM

## 2019-12-23 DIAGNOSIS — I25118 Atherosclerotic heart disease of native coronary artery with other forms of angina pectoris: Secondary | ICD-10-CM

## 2019-12-23 MED ORDER — ISOSORBIDE MONONITRATE ER 30 MG PO TB24: 15.0000 mg | ORAL_TABLET | Freq: Every day | ORAL | 3 refills | Status: DC

## 2019-12-23 NOTE — Telephone Encounter (Signed)
Rx request sent to pharmacy.  

## 2019-12-23 NOTE — Telephone Encounter (Signed)
*  STAT* If patient is at the pharmacy, call can be transferred to refill team.   1. Which medications need to be refilled? (please list name of each medication and dose if known)  isosorbide mononitrate 30 mg (.5 daily). generic if possible  2. Which pharmacy/location (including street and city if local pharmacy) is medication to be sent to? [facility]  3. Do they need a 30 day or 90 day supply? [place]

## 2020-01-27 ENCOUNTER — Other Ambulatory Visit: Payer: Self-pay

## 2020-01-27 ENCOUNTER — Ambulatory Visit (INDEPENDENT_AMBULATORY_CARE_PROVIDER_SITE_OTHER): Payer: Medicare Other

## 2020-01-27 ENCOUNTER — Telehealth: Payer: Self-pay

## 2020-01-27 ENCOUNTER — Other Ambulatory Visit: Payer: Self-pay | Admitting: Family

## 2020-01-27 DIAGNOSIS — I6523 Occlusion and stenosis of bilateral carotid arteries: Secondary | ICD-10-CM

## 2020-01-27 NOTE — Telephone Encounter (Signed)
-----   Message from Loel Dubonnet, NP sent at 01/27/2020  1:48 PM EST ----- 1-39% right internal carotid disease and 40-59% left internal carotid disease. This is stable compared to last year. Repeat carotid duplex 1 year. Good result!

## 2020-01-27 NOTE — Telephone Encounter (Signed)
Pt did not answer phone, reach out to daughter [name] who is on [place]. Able to update [name] regarding pt's recent Carotid Arterial Duplex Study          , Laurann Montana, NP had a chance to review his results and advised   "1-39% right internal carotid disease and 40-59% left internal carotid disease. This is stable compared to last year. Repeat carotid duplex 1 year. Good result!"  [name] happy with results and will inform pt of the results, otherwise all questions or concerns were address and no additional concerns at this time. Will continue pt's current plan of care, will call back for anything further.

## 2020-11-25 ENCOUNTER — Other Ambulatory Visit: Payer: Self-pay | Admitting: Family

## 2020-11-25 DIAGNOSIS — I25118 Atherosclerotic heart disease of native coronary artery with other forms of angina pectoris: Secondary | ICD-10-CM

## 2020-11-25 DIAGNOSIS — I10 Essential (primary) hypertension: Secondary | ICD-10-CM

## 2021-01-03 ENCOUNTER — Other Ambulatory Visit: Payer: Self-pay | Admitting: Family

## 2021-01-03 DIAGNOSIS — I25118 Atherosclerotic heart disease of native coronary artery with other forms of angina pectoris: Secondary | ICD-10-CM

## 2021-01-03 DIAGNOSIS — I10 Essential (primary) hypertension: Secondary | ICD-10-CM

## 2021-01-04 NOTE — Telephone Encounter (Signed)
Rx(s) sent to pharmacy electronically.  

## 2021-01-26 ENCOUNTER — Other Ambulatory Visit: Payer: Self-pay | Admitting: Family

## 2021-01-26 DIAGNOSIS — I25118 Atherosclerotic heart disease of native coronary artery with other forms of angina pectoris: Secondary | ICD-10-CM

## 2021-01-26 DIAGNOSIS — I10 Essential (primary) hypertension: Secondary | ICD-10-CM

## 2021-07-21 ENCOUNTER — Other Ambulatory Visit: Payer: Self-pay

## 2021-07-21 ENCOUNTER — Emergency Department: Payer: Medicare Other

## 2021-07-21 ENCOUNTER — Inpatient Hospital Stay
Admission: EM | Admit: 2021-07-21 | Discharge: 2021-07-28 | DRG: 280 | Disposition: A | Discharge status: Hospice | Payer: Medicare Other | Attending: Internal Medicine | Admitting: Internal Medicine

## 2021-07-21 ENCOUNTER — Encounter: Payer: Self-pay | Admitting: Emergency Medicine

## 2021-07-21 DIAGNOSIS — I959 Hypotension, unspecified: Secondary | ICD-10-CM | POA: Diagnosis not present

## 2021-07-21 DIAGNOSIS — J449 Chronic obstructive pulmonary disease, unspecified: Secondary | ICD-10-CM | POA: Diagnosis present

## 2021-07-21 DIAGNOSIS — R54 Age-related physical debility: Secondary | ICD-10-CM | POA: Diagnosis present

## 2021-07-21 DIAGNOSIS — T796XXA Traumatic ischemia of muscle, initial encounter: Secondary | ICD-10-CM | POA: Diagnosis present

## 2021-07-21 DIAGNOSIS — I2584 Coronary atherosclerosis due to calcified coronary lesion: Secondary | ICD-10-CM | POA: Diagnosis present

## 2021-07-21 DIAGNOSIS — I214 Non-ST elevation (NSTEMI) myocardial infarction: Principal | ICD-10-CM | POA: Diagnosis present

## 2021-07-21 DIAGNOSIS — M6282 Rhabdomyolysis: Secondary | ICD-10-CM | POA: Diagnosis present

## 2021-07-21 DIAGNOSIS — R443 Hallucinations, unspecified: Secondary | ICD-10-CM | POA: Diagnosis present

## 2021-07-21 DIAGNOSIS — Z87891 Personal history of nicotine dependence: Secondary | ICD-10-CM | POA: Diagnosis not present

## 2021-07-21 DIAGNOSIS — I5041 Acute combined systolic (congestive) and diastolic (congestive) heart failure: Secondary | ICD-10-CM | POA: Diagnosis present

## 2021-07-21 DIAGNOSIS — I10 Essential (primary) hypertension: Secondary | ICD-10-CM | POA: Diagnosis not present

## 2021-07-21 DIAGNOSIS — Z66 Do not resuscitate: Secondary | ICD-10-CM | POA: Diagnosis present

## 2021-07-21 DIAGNOSIS — I252 Old myocardial infarction: Secondary | ICD-10-CM

## 2021-07-21 DIAGNOSIS — I083 Combined rheumatic disorders of mitral, aortic and tricuspid valves: Secondary | ICD-10-CM | POA: Diagnosis present

## 2021-07-21 DIAGNOSIS — Y92009 Unspecified place in unspecified non-institutional (private) residence as the place of occurrence of the external cause: Secondary | ICD-10-CM | POA: Diagnosis not present

## 2021-07-21 DIAGNOSIS — Z9981 Dependence on supplemental oxygen: Secondary | ICD-10-CM

## 2021-07-21 DIAGNOSIS — Z8249 Family history of ischemic heart disease and other diseases of the circulatory system: Secondary | ICD-10-CM

## 2021-07-21 DIAGNOSIS — R296 Repeated falls: Secondary | ICD-10-CM | POA: Diagnosis present

## 2021-07-21 DIAGNOSIS — I509 Heart failure, unspecified: Secondary | ICD-10-CM | POA: Diagnosis not present

## 2021-07-21 DIAGNOSIS — I5043 Acute on chronic combined systolic (congestive) and diastolic (congestive) heart failure: Secondary | ICD-10-CM | POA: Diagnosis present

## 2021-07-21 DIAGNOSIS — J9611 Chronic respiratory failure with hypoxia: Secondary | ICD-10-CM | POA: Diagnosis present

## 2021-07-21 DIAGNOSIS — Z515 Encounter for palliative care: Secondary | ICD-10-CM | POA: Diagnosis not present

## 2021-07-21 DIAGNOSIS — K449 Diaphragmatic hernia without obstruction or gangrene: Secondary | ICD-10-CM | POA: Diagnosis present

## 2021-07-21 DIAGNOSIS — Z7189 Other specified counseling: Secondary | ICD-10-CM

## 2021-07-21 DIAGNOSIS — I2510 Atherosclerotic heart disease of native coronary artery without angina pectoris: Secondary | ICD-10-CM | POA: Diagnosis present

## 2021-07-21 DIAGNOSIS — K5900 Constipation, unspecified: Secondary | ICD-10-CM | POA: Diagnosis not present

## 2021-07-21 DIAGNOSIS — I255 Ischemic cardiomyopathy: Secondary | ICD-10-CM | POA: Diagnosis present

## 2021-07-21 DIAGNOSIS — N401 Enlarged prostate with lower urinary tract symptoms: Secondary | ICD-10-CM | POA: Diagnosis present

## 2021-07-21 DIAGNOSIS — R251 Tremor, unspecified: Secondary | ICD-10-CM | POA: Diagnosis present

## 2021-07-21 DIAGNOSIS — G9341 Metabolic encephalopathy: Secondary | ICD-10-CM | POA: Diagnosis present

## 2021-07-21 DIAGNOSIS — K760 Fatty (change of) liver, not elsewhere classified: Secondary | ICD-10-CM | POA: Diagnosis present

## 2021-07-21 DIAGNOSIS — I272 Pulmonary hypertension, unspecified: Secondary | ICD-10-CM | POA: Diagnosis present

## 2021-07-21 DIAGNOSIS — W19XXXA Unspecified fall, initial encounter: Secondary | ICD-10-CM | POA: Diagnosis present

## 2021-07-21 DIAGNOSIS — F32A Depression, unspecified: Secondary | ICD-10-CM | POA: Diagnosis present

## 2021-07-21 DIAGNOSIS — E785 Hyperlipidemia, unspecified: Secondary | ICD-10-CM | POA: Diagnosis present

## 2021-07-21 DIAGNOSIS — Z79899 Other long term (current) drug therapy: Secondary | ICD-10-CM

## 2021-07-21 DIAGNOSIS — K219 Gastro-esophageal reflux disease without esophagitis: Secondary | ICD-10-CM | POA: Diagnosis present

## 2021-07-21 DIAGNOSIS — I447 Left bundle-branch block, unspecified: Secondary | ICD-10-CM | POA: Diagnosis present

## 2021-07-21 DIAGNOSIS — I110 Hypertensive heart disease with heart failure: Secondary | ICD-10-CM | POA: Diagnosis present

## 2021-07-21 DIAGNOSIS — Y92239 Unspecified place in hospital as the place of occurrence of the external cause: Secondary | ICD-10-CM | POA: Diagnosis not present

## 2021-07-21 DIAGNOSIS — Z955 Presence of coronary angioplasty implant and graft: Secondary | ICD-10-CM | POA: Diagnosis not present

## 2021-07-21 DIAGNOSIS — E876 Hypokalemia: Secondary | ICD-10-CM | POA: Diagnosis not present

## 2021-07-21 DIAGNOSIS — R159 Full incontinence of feces: Secondary | ICD-10-CM | POA: Diagnosis present

## 2021-07-21 DIAGNOSIS — T502X5A Adverse effect of carbonic-anhydrase inhibitors, benzothiadiazides and other diuretics, initial encounter: Secondary | ICD-10-CM | POA: Diagnosis not present

## 2021-07-21 DIAGNOSIS — Z7982 Long term (current) use of aspirin: Secondary | ICD-10-CM

## 2021-07-21 DIAGNOSIS — R32 Unspecified urinary incontinence: Secondary | ICD-10-CM | POA: Diagnosis present

## 2021-07-21 HISTORY — DX: Disorder of arteries and arterioles, unspecified: I77.9

## 2021-07-21 LAB — BLOOD GAS, VENOUS
Acid-base deficit: 2 mmol/L (ref 0.0–2.0)
Bicarbonate: 23.7 mmol/L (ref 20.0–28.0)
O2 Saturation: 52.2 %
Patient temperature: 37
pCO2, Ven: 43 mmHg — ABNORMAL LOW (ref 44–60)
pH, Ven: 7.35 (ref 7.25–7.43)
pO2, Ven: 37 mmHg (ref 32–45)

## 2021-07-21 LAB — COMPREHENSIVE METABOLIC PANEL
ALT: 33 U/L (ref 0–44)
AST: 101 U/L — ABNORMAL HIGH (ref 15–41)
Albumin: 3.7 g/dL (ref 3.5–5.0)
Alkaline Phosphatase: 80 U/L (ref 38–126)
Anion gap: 12 (ref 5–15)
BUN: 19 mg/dL (ref 8–23)
CO2: 23 mmol/L (ref 22–32)
Calcium: 9.6 mg/dL (ref 8.9–10.3)
Chloride: 102 mmol/L (ref 98–111)
Creatinine, Ser: 0.99 mg/dL (ref 0.61–1.24)
GFR, Estimated: >60 mL/min (ref >60)
Glucose, Bld: 115 mg/dL — ABNORMAL HIGH (ref 70–99)
Potassium: 4.2 mmol/L (ref 3.5–5.1)
Sodium: 137 mmol/L (ref 135–145)
Total Bilirubin: 2.5 mg/dL — ABNORMAL HIGH (ref 0.3–1.2)
Total Protein: 8 g/dL (ref 6.5–8.1)

## 2021-07-21 LAB — URINALYSIS, ROUTINE W REFLEX MICROSCOPIC
Bacteria, UA: NONE SEEN
Bilirubin Urine: NEGATIVE
Glucose, UA: NEGATIVE mg/dL
Ketones, ur: 5 mg/dL — AB
Leukocytes,Ua: NEGATIVE
Nitrite: NEGATIVE
Protein, ur: 100 mg/dL — AB
Specific Gravity, Urine: 1.016 (ref 1.005–1.030)
Squamous Epithelial / HPF: NONE SEEN (ref 0–5)
pH: 5 (ref 5.0–8.0)

## 2021-07-21 LAB — CBC WITH DIFFERENTIAL/PLATELET
Abs Immature Granulocytes: 0.06 10*3/uL (ref 0.00–0.07)
Basophils Absolute: 0 10*3/uL (ref 0.0–0.1)
Basophils Relative: 0 %
Eosinophils Absolute: 0 10*3/uL (ref 0.0–0.5)
Eosinophils Relative: 0 %
HCT: 42.8 % (ref 39.0–52.0)
Hemoglobin: 13.3 g/dL (ref 13.0–17.0)
Immature Granulocytes: 0 %
Lymphocytes Relative: 5 %
Lymphs Abs: 0.7 10*3/uL (ref 0.7–4.0)
MCH: 27 pg (ref 26.0–34.0)
MCHC: 31.1 g/dL (ref 30.0–36.0)
MCV: 87 fL (ref 80.0–100.0)
Monocytes Absolute: 1 10*3/uL (ref 0.1–1.0)
Monocytes Relative: 7 %
Neutro Abs: 13 10*3/uL — ABNORMAL HIGH (ref 1.7–7.7)
Neutrophils Relative %: 88 %
Platelets: 202 10*3/uL (ref 150–400)
RBC: 4.92 MIL/uL (ref 4.22–5.81)
RDW: 13.4 % (ref 11.5–15.5)
WBC: 14.9 10*3/uL — ABNORMAL HIGH (ref 4.0–10.5)
nRBC: 0 % (ref 0.0–0.2)

## 2021-07-21 LAB — LACTIC ACID, PLASMA: Lactic Acid, Venous: 3.1 mmol/L (ref 0.5–1.9)

## 2021-07-21 LAB — PROTIME-INR
INR: 1.1 (ref 0.8–1.2)
Prothrombin Time: 13.9 seconds (ref 11.4–15.2)

## 2021-07-21 LAB — CK: Total CK: 3433 U/L — ABNORMAL HIGH (ref 49–397)

## 2021-07-21 LAB — BRAIN NATRIURETIC PEPTIDE: B Natriuretic Peptide: 1373.1 pg/mL — ABNORMAL HIGH (ref 0.0–100.0)

## 2021-07-21 LAB — TROPONIN I (HIGH SENSITIVITY): Troponin I (High Sensitivity): 525 ng/L (ref <18)

## 2021-07-21 MED ORDER — TRAZODONE HCL 50 MG PO TABS: 25.0000 mg | ORAL_TABLET | Freq: Every evening | ORAL | Status: DC | PRN

## 2021-07-21 MED ORDER — ACETAMINOPHEN 325 MG PO TABS: 650.0000 mg | ORAL_TABLET | Freq: Four times a day (QID) | ORAL | Status: DC | PRN

## 2021-07-21 MED ORDER — ONDANSETRON HCL 4 MG/2ML IJ SOLN: 4.0000 mg | Freq: Four times a day (QID) | INTRAMUSCULAR | Status: DC | PRN

## 2021-07-21 MED ORDER — IOHEXOL 300 MG/ML  SOLN
100.0000 mL | Freq: Once | INTRAMUSCULAR | Status: AC | PRN
  Administered 2021-07-21: 100 mL via INTRAVENOUS

## 2021-07-21 MED ORDER — SODIUM CHLORIDE 0.9 % IV SOLN: INTRAVENOUS | Status: DC

## 2021-07-21 MED ORDER — ASPIRIN 325 MG PO TBEC
325.0000 mg | DELAYED_RELEASE_TABLET | Freq: Every day | ORAL | Status: DC
  Administered 2021-07-22: 325 mg via ORAL
  Filled 2021-07-21: qty 1

## 2021-07-21 MED ORDER — IPRATROPIUM-ALBUTEROL 0.5-2.5 (3) MG/3ML IN SOLN
3.0000 mL | Freq: Once | RESPIRATORY_TRACT | Status: AC
  Administered 2021-07-21: 3 mL via RESPIRATORY_TRACT
  Filled 2021-07-21: qty 3

## 2021-07-21 MED ORDER — ASPIRIN 81 MG PO CHEW
324.0000 mg | CHEWABLE_TABLET | Freq: Once | ORAL | Status: AC
  Administered 2021-07-21: 324 mg via ORAL
  Filled 2021-07-21: qty 4

## 2021-07-21 MED ORDER — SODIUM CHLORIDE 0.9 % IV BOLUS
1000.0000 mL | Freq: Once | INTRAVENOUS | Status: AC
  Administered 2021-07-21: 1000 mL via INTRAVENOUS

## 2021-07-21 MED ORDER — ONDANSETRON HCL 4 MG PO TABS: 4.0000 mg | ORAL_TABLET | Freq: Four times a day (QID) | ORAL | Status: DC | PRN

## 2021-07-21 MED ORDER — MAGNESIUM HYDROXIDE 400 MG/5ML PO SUSP
30.0000 mL | Freq: Every day | ORAL | Status: DC | PRN
Start: 2021-07-21 — End: 2021-07-22

## 2021-07-21 MED ORDER — HEPARIN BOLUS VIA INFUSION
4000.0000 [IU] | Freq: Once | INTRAVENOUS | Status: AC
  Administered 2021-07-21: 4000 [IU] via INTRAVENOUS
  Filled 2021-07-21: qty 4000

## 2021-07-21 MED ORDER — ACETAMINOPHEN 650 MG RE SUPP: 650.0000 mg | Freq: Four times a day (QID) | RECTAL | Status: DC | PRN

## 2021-07-21 MED ORDER — SODIUM CHLORIDE 0.9 % IV SOLN: Freq: Once | INTRAVENOUS | Status: AC

## 2021-07-21 MED ORDER — HEPARIN (PORCINE) 25000 UT/250ML-% IV SOLN
1200.0000 [IU]/h | INTRAVENOUS | Status: DC
  Administered 2021-07-21: 950 [IU]/h via INTRAVENOUS
  Administered 2021-07-22: 1200 [IU]/h via INTRAVENOUS
  Filled 2021-07-21 (×2): qty 250

## 2021-07-21 MED ORDER — METOPROLOL TARTRATE 5 MG/5ML IV SOLN
5.0000 mg | Freq: Once | INTRAVENOUS | Status: AC
  Administered 2021-07-21: 5 mg via INTRAVENOUS
  Filled 2021-07-21: qty 5

## 2021-07-21 NOTE — ED Provider Notes (Signed)
[facility] Provider Note    Event Date/Time   First MD Initiated Contact with Patient 07/21/21 1800     (approximate)   History   Fall and Altered Mental Status  Level V caveat:  AMS  HPI  CASPAR FAVILA is a 88 y.o. male   with a history of heart failure and COPD he lives at home alone presents to the ER for evaluation of confusion after being found down on the ground by daughter.  Noted [place] him more than 24 to 36 hours.  I suspect he fell at some point last night.  Patient seems somewhat confused and reportedly was hallucinating when daughter found him.  He denies any specific pain or discomfort.      Physical Exam   Triage Vital Signs: ED Triage Vitals  Enc Vitals Group     BP 07/21/21 1733 91/60     Pulse Rate 07/21/21 1737 (!) 103     Resp 07/21/21 1733 16     Temp 07/21/21 1737 98.1 F (36.7 C)     Temp Source 07/21/21 1737 Oral     SpO2 07/21/21 1732 96 %     Weight --      Height 07/21/21 1734 '5\' 6"'$  (1.676 m)     Head Circumference --      Peak Flow --      Pain Score 07/21/21 1733 0     Pain Loc --      Pain Edu? --      Excl. in [place]? --     Most recent vital signs: Vitals:   07/21/21 1900 07/21/21 2000  BP: (!) 154/97 (!) 135/58  Pulse: (!) 104 (!) 101  Resp:  (!) 25  Temp:    SpO2: 96% 96%     Constitutional: Alert, pleasant and cooperative but frail and ill-appearing Eyes: Conjunctivae are normal.  Head: Atraumatic. Nose: No congestion/rhinnorhea. Mouth/Throat: Mucous membranes are moist.   Neck: Painless ROM.  Cardiovascular:   Good peripheral circulation. No m/g/r Respiratory: Normal respiratory effort.  No retractions.  Gastrointestinal: Soft and nontender.  Musculoskeletal:  no deformity Neurologic:  MAE spontaneously. No gross focal neurologic deficits are appreciated.  Skin:  Skin is warm, dry and intact. No rash noted. Psychiatric: Mood and affect are normal. Speech and behavior are  normal.    ED Results / Procedures / Treatments   Labs (all labs ordered are listed, but only abnormal results are displayed) Labs Reviewed  COMPREHENSIVE METABOLIC PANEL - Abnormal; Notable for the following components:      Result Value   Glucose, Bld 115 (*)    AST 101 (*)    Total Bilirubin 2.5 (*)    All other components within normal limits  LACTIC ACID, PLASMA - Abnormal; Notable for the following components:   Lactic Acid, Venous 3.1 (*)    All other components within normal limits  CBC WITH DIFFERENTIAL/PLATELET - Abnormal; Notable for the following components:   WBC 14.9 (*)    Neutro Abs 13.0 (*)    All other components within normal limits  URINALYSIS, ROUTINE W REFLEX MICROSCOPIC - Abnormal; Notable for the following components:   Color, Urine YELLOW (*)    APPearance CLEAR (*)    Hgb urine dipstick MODERATE (*)    Ketones, ur 5 (*)    Protein, ur 100 (*)    All other components within normal limits  CK - Abnormal; Notable for the following components:   Total  CK 3,433 (*)    All other components within normal limits  BLOOD GAS, VENOUS - Abnormal; Notable for the following components:   pCO2, Ven 43 (*)    All other components within normal limits  TROPONIN I (HIGH SENSITIVITY) - Abnormal; Notable for the following components:   Troponin I (High Sensitivity) 525 (*)    All other components within normal limits  CULTURE, BLOOD (ROUTINE X 2)  CULTURE, BLOOD (ROUTINE X 2)  PROTIME-INR  LACTIC ACID, PLASMA  BRAIN NATRIURETIC PEPTIDE  TROPONIN I (HIGH SENSITIVITY)     EKG  ED ECG REPORT I, Merlyn Lot, the attending physician, personally viewed and interpreted this ECG.   Date: 07/21/2021  EKG Time: 17:35  Rate: 100  Rhythm: sinus  Axis: normal  Intervals: lbbb  ST&T Change: no stemi, no depressions    RADIOLOGY Please see ED Course for my review and interpretation.  I personally reviewed all radiographic images ordered to evaluate for the  above acute complaints and reviewed radiology reports and findings.  These findings were personally discussed with the patient.  Please see medical record for radiology report.    PROCEDURES:  Critical Care performed: Yes, see critical care procedure note(s)  .Critical Care Performed by: Merlyn Lot, MD Authorized by: Merlyn Lot, MD   Critical care provider statement:    Critical care time (minutes):  35   Critical care was necessary to treat or prevent imminent or life-threatening deterioration of the following conditions:  Cardiac failure and circulatory failure   Critical care was time spent personally by me on the following activities:  Ordering and performing treatments and interventions, ordering and review of laboratory studies, ordering and review of radiographic studies, pulse oximetry, re-evaluation of patient's condition, review of old charts, obtaining history from patient or surrogate, examination of patient, evaluation of patient's response to treatment, discussions with primary provider, discussions with consultants and development of treatment plan with patient or surrogate   MEDICATIONS ORDERED IN ED: Medications  ipratropium-albuterol (DUONEB) 0.5-2.5 (3) MG/3ML nebulizer solution 3 mL (has no administration in time range)  aspirin chewable tablet 324 mg (has no administration in time range)  0.9 %  sodium chloride infusion (has no administration in time range)  sodium chloride 0.9 % bolus 1,000 mL (1,000 mLs Intravenous New Bag/Given 07/21/21 1849)  iohexol (OMNIPAQUE) 300 MG/ML solution 100 mL (100 mLs Intravenous Contrast Given 07/21/21 2037)     IMPRESSION / MDM / [place] / ED COURSE  I reviewed the triage vital signs and the nursing notes.                              Differential diagnosis includes, but is not limited to, Dehydration, sepsis, acs, pna, uti, hypoglycemia, cva, drug effect, rhabdo  Patient presenting to the ER with  symptoms as described above.  This presenting complaint could reflect a potentially life-threatening illness therefore the patient will be placed on continuous pulse oximetry and telemetry for monitoring.  Laboratory evaluation will be sent to evaluate for the above complaints.  CT imaging orderd for the above differential.   Clinical Course as of 07/21/21 2124  Wed Jul 21, 2021  1813 Chest x-ray on my interpretation concerning for infiltrates will await formal radiology report. [PR]  2057 Lactate is elevated but I suspect this is secondary to poor p.o. intake after patient being down for 24 hours.  Still awaiting CK as the tube was lost  in lab.  CT imaging of the abdomen pelvis on my interpretation does not show any evidence of free fluid or liver injury.  Will await formal radiology report. [PR]  2120 Patient does have evidence of rhabdo and significant troponin elevation.  He is complaining of some chest tightness therefore will heparinize as there is no sign of acute traumatic injury.  Will consult hospitalist for admission. [PR]    Clinical Course User Index [PR] Merlyn Lot, MD    Patient's presentation is most consistent with acute presentation with potential threat to life or bodily function.   FINAL CLINICAL IMPRESSION(S) / ED DIAGNOSES   Final diagnoses:  Traumatic rhabdomyolysis, initial encounter [facility])  Fall, initial encounter     Rx / DC Orders   ED Discharge Orders     None        Note:  This document was prepared using Dragon voice recognition software and may include unintentional dictation errors.    Merlyn Lot, MD 07/21/21 2124

## 2021-07-21 NOTE — H&P (Incomplete)
[place]   PATIENT NAME: Anthony Johns    MR#:  [id number]  DATE OF BIRTH:  [birth year]  DATE OF ADMISSION:  07/21/2021  PRIMARY CARE PHYSICIAN: Anthony Hire, MD   Patient is coming from: Home  REQUESTING/REFERRING PHYSICIAN: Merlyn Lot, MD  CHIEF COMPLAINT:   Chief Complaint  Patient presents with   Fall   Altered Mental Status    HISTORY OF PRESENT ILLNESS:  Anthony Johns is a 88 y.o. male with medical history significant for HFpEF, CAD, HTN and dyslipidemia, who presented to the emergency room with acute onset of ED Course: *** EKG as reviewed by me : *** Imaging: *** PAST MEDICAL HISTORY:   Past Medical History:  Diagnosis Date   (HFpEF) heart failure with preserved ejection fraction (Anthony Johns)    a. 06/2017 Echo: EF 60-65%, no rwma, Gr2 DD, mild MR, nl LA size, nl RV fxn, PASP wnl.   Coronary artery disease    a. He reports having myocardial infarction at the age of 52. He reports having multiple PCI's; b. 03/2008 Cath/PCI: LAD mild dzs, LCX 159m RCA 85d (3.0x18 Vision BMS); c. 07/2016 MV: EF 55-65%, mid inf/inflat infarct w/o ischemia.   Essential hypertension    Hiatal hernia    Hypercalcemia    Hyperlipidemia    Hypertension    Ischemic heart disease    [place]     PAST SURGICAL HISTORY:   Past Surgical History:  Procedure Laterality Date   CARDIAC CATHETERIZATION     [facility]   CORONARY ANGIOPLASTY WITH STENT PLACEMENT      SOCIAL HISTORY:   Social History   Tobacco Use   Smoking status: Former    Types: Cigarettes    Quit date: 03/25/1982    Years since quitting: 39.3   Smokeless tobacco: Never  Substance Use Topics   Alcohol use: Yes    Comment: occas.     FAMILY HISTORY:   Family History  Problem Relation Age of Onset   Hypertension Father     DRUG ALLERGIES:   Allergies  Allergen Reactions   Tape     REVIEW OF SYSTEMS:   ROS As per history of present illness. All pertinent systems were  reviewed above. Constitutional, HEENT, cardiovascular, respiratory, GI, GU, musculoskeletal, neuro, psychiatric, endocrine, integumentary and hematologic systems were reviewed and are otherwise negative/unremarkable except for positive findings mentioned above in the HPI.   MEDICATIONS AT HOME:   Prior to Admission medications   Medication Sig Start Date End Date Taking? Authorizing Provider  amLODipine (NORVASC) 10 MG tablet Take 10 mg by mouth daily.  04/21/14   [provider]  aspirin EC 81 MG tablet Take 81 mg by mouth daily.     [provider]  carvedilol (COREG) 6.25 MG tablet Take 1 tablet (6.25 mg total) by mouth 2 (two) times daily. 09/12/18   Anthony Johns  cyanocobalamin 1000 MCG tablet Take by mouth.    [provider]  isosorbide mononitrate (IMDUR) 30 MG 24 hr tablet TAKE 1/2 TABLET DAILY 01/26/21   Anthony Johns  losartan (COZAAR) 100 MG tablet Take 100 mg by mouth daily.  04/21/14   [provider]  magnesium oxide (MAG-OX) 400 MG tablet Take 400 mg by mouth daily.     [provider]  nitroGLYCERIN (NITROSTAT) 0.4 MG SL tablet Place 0.4 mg under the tongue every 5 (five) minutes as needed.  04/21/14   [provider]  omeprazole (PRILOSEC) 20 MG capsule TAKE 1 CAPSULE DAILY 11/13/13   [provider]  sertraline (ZOLOFT) 25 MG tablet Take 25 mg by mouth daily. 02/15/17   [provider]  simvastatin (ZOCOR) 20 MG tablet Take 20 mg by mouth daily at 6 PM.  04/21/14   [provider]      VITAL SIGNS:  Blood pressure (!) 135/58, pulse (!) 101, temperature 98.1 F (36.7 C), temperature source Oral, resp. rate (!) 25, height '5\' 6"'$  (1.676 m), weight 78.8 kg, SpO2 96 %.  PHYSICAL EXAMINATION:  Physical Exam  GENERAL:  88 y.o.-year-old patient lying in the bed with no acute distress.  EYES: Pupils equal, round, reactive to light and accommodation. No scleral icterus. Extraocular muscles  intact.  HEENT: Head atraumatic, normocephalic. Oropharynx and nasopharynx clear.  NECK:  Supple, no jugular venous distention. No thyroid enlargement, no tenderness.  LUNGS: Normal breath sounds bilaterally, no wheezing, rales,rhonchi or crepitation. No use of accessory muscles of respiration.  CARDIOVASCULAR: Regular rate and rhythm, S1, S2 normal. No murmurs, rubs, or gallops.  ABDOMEN: Soft, nondistended, nontender. Bowel sounds present. No organomegaly or mass.  EXTREMITIES: No pedal edema, cyanosis, or clubbing.  NEUROLOGIC: Cranial nerves II through XII are intact. Muscle strength 5/5 in all extremities. Sensation intact. Gait not checked.  PSYCHIATRIC: The patient is alert and oriented x 3.  Normal affect and good eye contact. SKIN: No obvious rash, lesion, or ulcer.   LABORATORY PANEL:   CBC Recent Labs  Lab 07/21/21 1739  WBC 14.9*  HGB 13.3  HCT 42.8  PLT 202   ------------------------------------------------------------------------------------------------------------------  Chemistries  Recent Labs  Lab 07/21/21 1739  NA 137  K 4.2  CL 102  CO2 23  GLUCOSE 115*  BUN 19  CREATININE 0.99  CALCIUM 9.6  AST 101*  ALT 33  ALKPHOS 80  BILITOT 2.5*   ------------------------------------------------------------------------------------------------------------------  Cardiac Enzymes No results for input(s): TROPONINI in the last 168 hours. ------------------------------------------------------------------------------------------------------------------  RADIOLOGY:  CT HEAD WO CONTRAST (5MM)  Result Date: 07/21/2021 CLINICAL DATA:  Altered mental status, patient found down on floor. EXAM: CT HEAD WITHOUT CONTRAST TECHNIQUE: Contiguous axial images were obtained from the base of the skull through the vertex without intravenous contrast. RADIATION DOSE REDUCTION: This exam was performed according to the departmental dose-optimization program which includes automated  exposure control, adjustment of the mA and/or kV according to patient size and/or use of iterative reconstruction technique. COMPARISON:  None Available. FINDINGS: Brain: Remote lacunar infarcts of the left caudate head and right caudate body. Periventricular white matter and corona radiata hypodensities favor chronic ischemic microvascular white matter disease. Otherwise, the brainstem, cerebellum, cerebral peduncles, thalamus, basal ganglia, basilar cisterns, and ventricular system appear within normal limits. No intracranial hemorrhage, mass lesion, or acute CVA. Vascular: There is atherosclerotic calcification of the cavernous carotid arteries bilaterally. Skull: Unremarkable Sinuses/Orbits: Mild chronic ethmoid and right frontal sinusitis. Other: No supplemental non-categorized findings. IMPRESSION: 1. No acute intracranial findings. 2. Remote lacunar infarcts of the caudate nuclei. 3. Periventricular white matter and corona radiata hypodensities favor chronic ischemic microvascular white matter disease. 4. Atherosclerosis. 5. Mild chronic ethmoid and right frontal sinusitis. Electronically Signed   By: Van Clines M.D.   On: 07/21/2021 18:50   CT ABDOMEN PELVIS W CONTRAST  Result Date: 07/21/2021 CLINICAL DATA:  Trauma, fall EXAM: CT ABDOMEN AND PELVIS WITH CONTRAST TECHNIQUE: Multidetector CT imaging of the abdomen and pelvis was performed using the standard protocol following bolus administration  of intravenous contrast. RADIATION DOSE REDUCTION: This exam was performed according to the departmental dose-optimization program which includes automated exposure control, adjustment of the mA and/or kV according to patient size and/or use of iterative reconstruction technique. CONTRAST:  135m OMNIPAQUE IOHEXOL 300 MG/ML  SOLN COMPARISON:  None Available. FINDINGS: Lower chest: There are few small nodular densities in both lower lung fields largest measuring 10 mm in size in the left lower lobe. These  nodules have been seen in the previous examinations dating as far back as 05/25/2017. Nodule in the left lower lobe measures minimally larger. Coronary artery calcifications are seen. Hepatobiliary: There is fatty infiltration in the liver. Gallbladder is distended. There is possible minimal amount of fluid adjacent to the inferior margin of the liver. There is slight motion artifact adjacent to the liver and gallbladder limiting evaluation. In the delayed images, there is no definite wall thickening in gallbladder and there is no definite evidence of any fluid adjacent to the liver. Pancreas: There is pancreatic atrophy. No focal abnormality is seen. Spleen: Spleen is enlarged measuring 14.2 cm in maximum diameter. Adrenals/Urinary Tract: Nodularity seen in both adrenals has not changed significantly since 11/14/2019. There is no hydronephrosis. There are no renal or ureteral stones. Urinary bladder is unremarkable. Stomach/Bowel: There is moderate sized hiatal hernia. Stomach is not distended. Small bowel loops are not dilated. Appendix is not distinctly seen. There is no pericecal inflammation. There is no significant wall thickening in colon. There is no pericolic stranding. Scattered diverticula are seen in colon without signs of focal diverticulitis. Vascular/Lymphatic: Arterial calcifications are seen. Reproductive: Prostate is enlarged with inhomogeneous attenuation. Other: There is no ascites or pneumoperitoneum. Left inguinal hernia containing fat is seen. Musculoskeletal: Degenerative changes are noted in the lumbar spine with disc space narrowing, bony spurs and facet hypertrophy. IMPRESSION: There is no evidence of intestinal obstruction or pneumoperitoneum. There is no hydronephrosis. There are few nodules in both lower lung fields. Nodule in the left lower lobe appears minimally more prominent. However, the nodules have been noted in the previous studies dating as far back as 05/25/2017, most likely  suggesting benign etiology. Fatty liver. Moderate sized hiatal hernia. Enlarged spleen. Diverticulosis of colon without signs of diverticulitis. Enlarged prostate. Gallbladder is distended without definite wall thickening. There is no dilation of bile ducts. In the some of the images motion artifacts limit evaluation of liver and gallbladder. If there are any focal symptoms in the right upper quadrant, follow-up sonogram may be considered. Other findings as described in the body of the report. Electronically Signed   By: PElmer PickerM.D.   On: 07/21/2021 21:04   DG Chest Port 1 View  Result Date: 07/21/2021 CLINICAL DATA:  [id number] AMS; Questionable sepsis; Pt to ED via [facility] from home for fall. Pt was found on the floor this this afternoon by family, unknown when he fell. Non of his morning medications were taken and walker was tipped over in the floor. EXAM: PORTABLE CHEST 1 VIEW COMPARISON:  CT chest 11/14/2019, chest x-ray 07/30/2017 FINDINGS: The heart and mediastinal contours are unchanged. Aortic calcification. No definite focal consolidation. Biapical pleural/pulmonary scarring. Diffuse chronic interstitial markings with no overt pulmonary edema. No pleural effusion. No pneumothorax. No acute osseous abnormality. IMPRESSION: 1. No active disease. 2. Aortic Atherosclerosis (ICD10-I70.0) and Emphysema (ICD10-J43.9). Electronically Signed   By: MIven FinnM.D.   On: 07/21/2021 18:19      IMPRESSION AND PLAN:  Assessment and Plan: No notes have been filed  under this hospital service. Service: Hospitalist      DVT prophylaxis: Lovenox***  Advanced Care Planning:  Code Status: full code***  Family Communication:  The plan of care was discussed in details with the patient (and family). I answered all questions. The patient agreed to proceed with the above mentioned plan. Further management will depend upon hospital course. Disposition Plan: Back to previous home  environment Consults called: none***  All the records are reviewed and case discussed with ED provider.  Status is: Inpatient {Inpatient:23812}   At the time of the admission, it appears that the appropriate admission status for this patient is inpatient.  This is judged to be reasonable and necessary in order to provide the required intensity of service to ensure the patient's safety given the presenting symptoms, physical exam findings and initial radiographic and laboratory data in the context of comorbid conditions.  The patient requires inpatient status due to high intensity of service, high risk of further deterioration and high frequency of surveillance required.  I certify that at the time of admission, it is my clinical judgment that the patient will require inpatient hospital care extending more than 2 midnights.                            Dispo: The patient is from: Home              Anticipated d/c is to: Home              Patient currently is not medically stable to d/c.              Difficult to place patient: No  Christel Mormon M.D on 07/21/2021 at 10:11 PM  [facility]   From 7 PM-7 AM, contact night-coverage [email or web address]  CC: Primary care physician; Anthony Hire, MD

## 2021-07-21 NOTE — ED Notes (Signed)
Pt taken to CT.

## 2021-07-21 NOTE — ED Triage Notes (Signed)
Pt to ED via [facility] from home for fall. Pt was found on the floor this this afternoon by family, unknown when he fell. Non of his morning medications were taken and walker was tipped over in the floor. EMS reports strong urine smell. Pt family reports that pt was hallucinating.

## 2021-07-21 NOTE — Consult Note (Signed)
ANTICOAGULATION CONSULT NOTE - Initial Consult  Pharmacy Consult for heparin infusion Indication: chest pain/ACS  Allergies  Allergen Reactions   Tape     Patient Measurements: Height: '5\' 6"'$  (167.6 cm) IBW/kg (Calculated) : 63.8 Heparin Dosing Weight: 78.8 kg  Vital Signs: Temp: 98.1 F (36.7 C) (05/31 1737) Temp Source: Oral (05/31 1737) BP: 135/58 (05/31 2000) Pulse Rate: 101 (05/31 2000)  Labs: Recent Labs    07/21/21 1739  HGB 13.3  HCT 42.8  PLT 202  LABPROT 13.9  INR 1.1  CREATININE 0.99  CKTOTAL 3,433*  TROPONINIHS 525*    CrCl cannot be calculated (Unknown ideal weight.).   Medical History: Past Medical History:  Diagnosis Date   (HFpEF) heart failure with preserved ejection fraction ([place])    a. 06/2017 Echo: EF 60-65%, no rwma, Gr2 DD, mild MR, nl LA size, nl RV fxn, PASP wnl.   Coronary artery disease    a. He reports having myocardial infarction at the age of 61. He reports having multiple PCI's; b. 03/2008 Cath/PCI: LAD mild dzs, LCX 160m RCA 85d (3.0x18 Vision BMS); c. 07/2016 MV: EF 55-65%, mid inf/inflat infarct w/o ischemia.   Essential hypertension    Hiatal hernia    Hypercalcemia    Hyperlipidemia    Hypertension    Ischemic heart disease    MI (myocardial infarction) ([place]     Medications:  No prior anticoagulation noted  Assessment: 88y.o. male   with a history of heart failure and COPD he lives at home alone presents to the ER for evaluation of confusion after being found down on the ground by daughter. Elevated Troponin I at 525. Pharmacy has been consulted to initiate and manage heparin infusion  Goal of Therapy:  Heparin level 0.3-0.7 units/ml Monitor platelets by anticoagulation protocol: Yes   Plan:  Give 4000 units bolus x 1 Start heparin infusion at 950 units/hr Check anti-Xa level in 8 hours  Continue to monitor H&H and platelets  CDorothe Pea PharmD, BCPS Clinical Pharmacist   07/21/2021,9:28 PM

## 2021-07-22 ENCOUNTER — Inpatient Hospital Stay: Payer: Medicare Other

## 2021-07-22 ENCOUNTER — Other Ambulatory Visit: Payer: Self-pay

## 2021-07-22 ENCOUNTER — Inpatient Hospital Stay (HOSPITAL_COMMUNITY)
Admit: 2021-07-22 | Discharge: 2021-07-22 | Disposition: A | Discharge status: Home / Self Care | Payer: Medicare Other | Attending: Family Medicine | Admitting: Family Medicine

## 2021-07-22 ENCOUNTER — Encounter: Payer: Self-pay | Admitting: Family Medicine

## 2021-07-22 DIAGNOSIS — I214 Non-ST elevation (NSTEMI) myocardial infarction: Secondary | ICD-10-CM

## 2021-07-22 DIAGNOSIS — K219 Gastro-esophageal reflux disease without esophagitis: Secondary | ICD-10-CM | POA: Diagnosis present

## 2021-07-22 DIAGNOSIS — F32A Depression, unspecified: Secondary | ICD-10-CM | POA: Diagnosis present

## 2021-07-22 DIAGNOSIS — W19XXXA Unspecified fall, initial encounter: Secondary | ICD-10-CM

## 2021-07-22 DIAGNOSIS — K449 Diaphragmatic hernia without obstruction or gangrene: Secondary | ICD-10-CM

## 2021-07-22 DIAGNOSIS — R251 Tremor, unspecified: Secondary | ICD-10-CM | POA: Diagnosis present

## 2021-07-22 DIAGNOSIS — G9341 Metabolic encephalopathy: Secondary | ICD-10-CM | POA: Diagnosis present

## 2021-07-22 DIAGNOSIS — I509 Heart failure, unspecified: Secondary | ICD-10-CM | POA: Diagnosis not present

## 2021-07-22 DIAGNOSIS — I5041 Acute combined systolic (congestive) and diastolic (congestive) heart failure: Secondary | ICD-10-CM | POA: Diagnosis present

## 2021-07-22 DIAGNOSIS — R296 Repeated falls: Secondary | ICD-10-CM | POA: Diagnosis present

## 2021-07-22 LAB — ECHOCARDIOGRAM COMPLETE
AR max vel: 2.09 cm2
AV Area VTI: 2.15 cm2
AV Area mean vel: 1.94 cm2
AV Mean grad: 4 mmHg
AV Peak grad: 8 mmHg
Ao pk vel: 1.41 m/s
Area-P 1/2: 8.07 cm2
Calc EF: 43.3 %
Height: 66 in
MV VTI: 1.42 cm2
S' Lateral: 3.73 cm
Single Plane A2C EF: 42.2 %
Single Plane A4C EF: 44.7 %
Weight: 2779.56 oz

## 2021-07-22 LAB — BASIC METABOLIC PANEL
Anion gap: 10 (ref 5–15)
BUN: 24 mg/dL — ABNORMAL HIGH (ref 8–23)
CO2: 21 mmol/L — ABNORMAL LOW (ref 22–32)
Calcium: 9 mg/dL (ref 8.9–10.3)
Chloride: 109 mmol/L (ref 98–111)
Creatinine, Ser: 1.06 mg/dL (ref 0.61–1.24)
GFR, Estimated: >60 mL/min (ref >60)
Glucose, Bld: 109 mg/dL — ABNORMAL HIGH (ref 70–99)
Potassium: 4.2 mmol/L (ref 3.5–5.1)
Sodium: 140 mmol/L (ref 135–145)

## 2021-07-22 LAB — CBC
HCT: 38 % — ABNORMAL LOW (ref 39.0–52.0)
Hemoglobin: 11.9 g/dL — ABNORMAL LOW (ref 13.0–17.0)
MCH: 27.4 pg (ref 26.0–34.0)
MCHC: 31.3 g/dL (ref 30.0–36.0)
MCV: 87.4 fL (ref 80.0–100.0)
Platelets: 148 10*3/uL — ABNORMAL LOW (ref 150–400)
RBC: 4.35 MIL/uL (ref 4.22–5.81)
RDW: 13.6 % (ref 11.5–15.5)
WBC: 11.7 10*3/uL — ABNORMAL HIGH (ref 4.0–10.5)
nRBC: 0 % (ref 0.0–0.2)

## 2021-07-22 LAB — TROPONIN I (HIGH SENSITIVITY)
Troponin I (High Sensitivity): 5944 ng/L (ref <18)
Troponin I (High Sensitivity): 10784 ng/L (ref <18)

## 2021-07-22 LAB — HEPARIN LEVEL (UNFRACTIONATED)
Heparin Unfractionated: 0.17 IU/mL — ABNORMAL LOW (ref 0.30–0.70)
Heparin Unfractionated: 0.38 IU/mL (ref 0.30–0.70)

## 2021-07-22 LAB — CK: Total CK: 1800 U/L — ABNORMAL HIGH (ref 49–397)

## 2021-07-22 LAB — LACTIC ACID, PLASMA: Lactic Acid, Venous: 1.5 mmol/L (ref 0.5–1.9)

## 2021-07-22 MED ORDER — PANTOPRAZOLE SODIUM 40 MG PO TBEC
40.0000 mg | DELAYED_RELEASE_TABLET | Freq: Every day | ORAL | Status: DC
  Administered 2021-07-22 – 2021-07-27 (×6): 40 mg via ORAL
  Filled 2021-07-22 (×6): qty 1

## 2021-07-22 MED ORDER — AMLODIPINE BESYLATE 5 MG PO TABS
10.0000 mg | ORAL_TABLET | Freq: Every day | ORAL | Status: DC
Start: 2021-07-22 — End: 2021-07-22

## 2021-07-22 MED ORDER — ISOSORBIDE MONONITRATE ER 30 MG PO TB24
15.0000 mg | ORAL_TABLET | Freq: Every day | ORAL | Status: DC
  Administered 2021-07-22 – 2021-07-23 (×2): 15 mg via ORAL
  Filled 2021-07-22 (×2): qty 1

## 2021-07-22 MED ORDER — PERFLUTREN LIPID MICROSPHERE
1.0000 mL | INTRAVENOUS | Status: AC | PRN
  Administered 2021-07-22: 3 mL via INTRAVENOUS

## 2021-07-22 MED ORDER — CARVEDILOL 6.25 MG PO TABS
6.2500 mg | ORAL_TABLET | Freq: Two times a day (BID) | ORAL | Status: DC
Start: 2021-07-22 — End: 2021-07-26
  Administered 2021-07-22 – 2021-07-26 (×9): 6.25 mg via ORAL
  Filled 2021-07-22 (×9): qty 1

## 2021-07-22 MED ORDER — VITAMIN B-12 1000 MCG PO TABS
1000.0000 ug | ORAL_TABLET | Freq: Every day | ORAL | Status: DC
  Administered 2021-07-22 – 2021-07-28 (×7): 1000 ug via ORAL
  Filled 2021-07-22 (×7): qty 1

## 2021-07-22 MED ORDER — IPRATROPIUM-ALBUTEROL 0.5-2.5 (3) MG/3ML IN SOLN
3.0000 mL | RESPIRATORY_TRACT | Status: DC | PRN
  Administered 2021-07-22: 3 mL via RESPIRATORY_TRACT
  Filled 2021-07-22: qty 3

## 2021-07-22 MED ORDER — HEPARIN BOLUS VIA INFUSION
2400.0000 [IU] | Freq: Once | INTRAVENOUS | Status: AC
  Administered 2021-07-22: 2400 [IU] via INTRAVENOUS
  Filled 2021-07-22: qty 2400

## 2021-07-22 MED ORDER — FUROSEMIDE 10 MG/ML IJ SOLN
40.0000 mg | Freq: Two times a day (BID) | INTRAMUSCULAR | Status: DC
Start: 2021-07-22 — End: 2021-07-23
  Administered 2021-07-22: 40 mg via INTRAVENOUS
  Filled 2021-07-22: qty 4

## 2021-07-22 MED ORDER — NITROGLYCERIN 0.4 MG SL SUBL: 0.4000 mg | SUBLINGUAL_TABLET | SUBLINGUAL | Status: DC | PRN

## 2021-07-22 MED ORDER — ALBUTEROL SULFATE (2.5 MG/3ML) 0.083% IN NEBU
2.5000 mg | INHALATION_SOLUTION | Freq: Once | RESPIRATORY_TRACT | Status: AC
  Administered 2021-07-22: 2.5 mg via RESPIRATORY_TRACT
  Filled 2021-07-22: qty 3

## 2021-07-22 MED ORDER — SERTRALINE HCL 50 MG PO TABS
50.0000 mg | ORAL_TABLET | Freq: Every day | ORAL | Status: DC
  Administered 2021-07-22 – 2021-07-28 (×7): 50 mg via ORAL
  Filled 2021-07-22 (×7): qty 1

## 2021-07-22 MED ORDER — MAGNESIUM OXIDE 400 MG PO TABS
400.0000 mg | ORAL_TABLET | Freq: Every day | ORAL | Status: DC
Start: 2021-07-22 — End: 2021-07-22
  Filled 2021-07-22: qty 1

## 2021-07-22 MED ORDER — MORPHINE SULFATE (PF) 2 MG/ML IV SOLN: 2.0000 mg | INTRAVENOUS | Status: DC | PRN

## 2021-07-22 MED ORDER — ASPIRIN 81 MG PO CHEW
81.0000 mg | CHEWABLE_TABLET | Freq: Every day | ORAL | Status: DC
  Administered 2021-07-23 – 2021-07-28 (×6): 81 mg via ORAL
  Filled 2021-07-22 (×6): qty 1

## 2021-07-22 MED ORDER — FUROSEMIDE 10 MG/ML IJ SOLN
40.0000 mg | Freq: Once | INTRAMUSCULAR | Status: AC
  Administered 2021-07-22: 40 mg via INTRAVENOUS
  Filled 2021-07-22: qty 4

## 2021-07-22 MED ORDER — LOSARTAN POTASSIUM 50 MG PO TABS: 100.0000 mg | ORAL_TABLET | Freq: Every day | ORAL | Status: DC

## 2021-07-22 MED ORDER — ATORVASTATIN CALCIUM 80 MG PO TABS
80.0000 mg | ORAL_TABLET | Freq: Every day | ORAL | Status: DC
  Administered 2021-07-22 – 2021-07-28 (×7): 80 mg via ORAL
  Filled 2021-07-22 (×7): qty 1

## 2021-07-22 NOTE — Assessment & Plan Note (Addendum)
Acute combined systolic and diastolic CHF. Presents with an unwitnessed fall.  Appears to have severe shortness of breath Troponin peaked at 18,000. Cardiology consulted. EKG unremarkable for any acute ischemia. Echocardiogram shows a significant drop in EF from normal to 30 to 35% with global hypokinesis and moderate MR. Was on IV heparin. Underwent right and left heart catheterization on 6/2.  No culprit lesion identified, chronic appearing LCx and RCA stenosis with collaterals.  Stenosis in LAD as well. Medical management recommended. Currently on Aldactone, Lasix, Coreg 6.25 mg twice daily, Lipitor 80 mg daily, aspirin 81 mg daily. ACE inhibitor/ARB/ARNI outpatient due to relative hypotension. Continue Lasix.

## 2021-07-22 NOTE — Assessment & Plan Note (Addendum)
Patient is on multiple antihypertensive medication at home. Currently continuing Imdur, Coreg, Lasix. Holding losartan and Norvasc.

## 2021-07-22 NOTE — Consult Note (Signed)
ANTICOAGULATION CONSULT NOTE - Initial Consult  Pharmacy Consult for heparin infusion Indication: chest pain/ACS  Allergies  Allergen Reactions   Tape     Patient Measurements: Height: '5\' 6"'$  (167.6 cm) Weight: 76.6 kg (168 lb 12.8 oz) IBW/kg (Calculated) : 63.8 Heparin Dosing Weight: 78.8 kg  Vital Signs: Temp: 98.4 F (36.9 C) (06/01 1547) Temp Source: Axillary (06/01 1547) BP: 144/96 (06/01 1617) Pulse Rate: 93 (06/01 1547)  Labs: Recent Labs    07/21/21 1739 07/22/21 0712 07/22/21 1711  HGB 13.3 11.9*  --   HCT 42.8 38.0*  --   PLT 202 148*  --   LABPROT 13.9  --   --   INR 1.1  --   --   HEPARINUNFRC  --  0.17* 0.38  CREATININE 0.99 1.06  --   CKTOTAL 3,433* 1,800*  --   TROPONINIHS 525* 5,944*  --      Estimated Creatinine Clearance: 43.3 mL/min (by C-G formula based on SCr of 1.06 mg/dL).   Medical History: Past Medical History:  Diagnosis Date   (HFpEF) heart failure with preserved ejection fraction ([place])    a. 06/2017 Echo: EF 60-65%, no rwma, Gr2 DD, mild MR, nl LA size, nl RV fxn, PASP wnl; b. 07/2017 Echo: EF 60-65%, no rwma, GrII DD, mild MR, nl RV fxn.   Carotid arterial disease ([place])    a. 01/2020 U/S: RICA 8-33%, RECA >82%, LICA 50-53%.   Coronary artery disease    a. He reports having myocardial infarction at the age of 73. He reports having multiple PCI's; b. 03/2008 Cath/PCI: LAD mild dzs, LCX 13m RCA 85d (3.0x18 Vision BMS); c. 07/2016 MV: EF 55-65%, mid inf/inflat infarct w/o ischemia; d. 08/2017 MV: EF 55-65%, no isch/infarct.   Essential hypertension    Hiatal hernia    Hypercalcemia    Hyperlipidemia    Hypertension    Ischemic heart disease    MI (myocardial infarction) ([place]     Medications:  No prior anticoagulation noted  Assessment: 88y.o. male   with a history of heart failure and COPD he lives at home alone presents to the ER for evaluation of confusion after being found down on the ground by daughter. Elevated Troponin I  at 525. Pharmacy has been consulted to initiate and manage heparin infusion   Goal of Therapy:  Heparin level 0.3-0.7 units/ml Monitor platelets by anticoagulation protocol: Yes   Plan:  6/2'@0111'$ : HL 0.37, therapeutic Continue heparin infusion at 1200 units/hr Recheck HL daily w/ AM labs while therapeutic Continue to monitor H&H and platelets(currently downtrending, but likely dilutional)  NRenda Rolls PharmD, [facility]6/03/2021 1:57 AM

## 2021-07-22 NOTE — Progress Notes (Signed)
Echo at bedside. Will move patient to ready bed once echo is completed.

## 2021-07-22 NOTE — Assessment & Plan Note (Signed)
Continue PPI therapy. 

## 2021-07-22 NOTE — ED Notes (Signed)
Patient sleeping at this time. Respirations even and unlabored on 2L O2. Family at bedside.

## 2021-07-22 NOTE — Assessment & Plan Note (Signed)
Incidentally seen on the CT scan.  Monitor.

## 2021-07-22 NOTE — Progress Notes (Signed)
Admission profile updated. ?

## 2021-07-22 NOTE — Assessment & Plan Note (Addendum)
Continue Zoloft. On trazodone nightly.

## 2021-07-22 NOTE — Hospital Course (Addendum)
Possible history of CAD, HTN, HFpEF, HLD presented to the hospital with an unwitnessed fall where the patient describes that he lowered himself to the ground but unknown downtime.  Found to have elevated CK as well as troponin which trended up significantly.  Cardiology consulted.  Found to have acute systolic CHF.   Underwent cardiac catheterization on 6/2.  No culprit lesion.  Medically managed. Continue remains volume overloaded requiring IV Lasix. 6/6.  He wants to go home regardless of the outcome.  Further about goals of care discussion done with daughter.  Currently plan is to go home with hospice on 6/7.  Hospice formally consulted.  Palliative care consulted.

## 2021-07-22 NOTE — Assessment & Plan Note (Signed)
Somewhat confused and poor historian. Unsure what is the baseline. Monitor for now.

## 2021-07-22 NOTE — Assessment & Plan Note (Addendum)
Continue statin. 

## 2021-07-22 NOTE — Consult Note (Signed)
ANTICOAGULATION CONSULT NOTE - Initial Consult  Pharmacy Consult for heparin infusion Indication: chest pain/ACS  Allergies  Allergen Reactions   Tape     Patient Measurements: Height: '5\' 6"'$  (167.6 cm) Weight: 78.8 kg (173 lb 11.6 oz) IBW/kg (Calculated) : 63.8 Heparin Dosing Weight: 78.8 kg  Vital Signs: BP: 111/78 (06/01 0727) Pulse Rate: 100 (06/01 0727)  Labs: Recent Labs    07/21/21 1739 07/22/21 0712  HGB 13.3 11.9*  HCT 42.8 38.0*  PLT 202 148*  LABPROT 13.9  --   INR 1.1  --   HEPARINUNFRC  --  0.17*  CREATININE 0.99 1.06  CKTOTAL 3,433* 1,800*  TROPONINIHS 525* 5,944*     Estimated Creatinine Clearance: 43.9 mL/min (by C-G formula based on SCr of 1.06 mg/dL).   Medical History: Past Medical History:  Diagnosis Date   (HFpEF) heart failure with preserved ejection fraction ([place])    a. 06/2017 Echo: EF 60-65%, no rwma, Gr2 DD, mild MR, nl LA size, nl RV fxn, PASP wnl; b. 07/2017 Echo: EF 60-65%, no rwma, GrII DD, mild MR, nl RV fxn.   Carotid arterial disease ([place])    a. 01/2020 U/S: RICA 1-43%, RECA >88%, LICA 87-57%.   Coronary artery disease    a. He reports having myocardial infarction at the age of 44. He reports having multiple PCI's; b. 03/2008 Cath/PCI: LAD mild dzs, LCX 116m RCA 85d (3.0x18 Vision BMS); c. 07/2016 MV: EF 55-65%, mid inf/inflat infarct w/o ischemia; d. 08/2017 MV: EF 55-65%, no isch/infarct.   Essential hypertension    Hiatal hernia    Hypercalcemia    Hyperlipidemia    Hypertension    Ischemic heart disease    MI (myocardial infarction) ([place]     Medications:  No prior anticoagulation noted  Assessment: 88y.o. male   with a history of heart failure and COPD he lives at home alone presents to the ER for evaluation of confusion after being found down on the ground by daughter. Elevated Troponin I at 525. Pharmacy has been consulted to initiate and manage heparin infusion   Goal of Therapy:  Heparin level 0.3-0.7  units/ml Monitor platelets by anticoagulation protocol: Yes   Plan:  6/1'@0712'$ : HL 0.17, subtherapeutic 2400 units bolus x 1 Increase heparin infusion to 1200 units/hr Check anti-Xa level in 8 hours  Continue to monitor H&H and platelets(currently downtrending, but likely dilutional)  WPearla Dubonnet PharmD Clinical Pharmacist 07/22/2021 8:10 AM

## 2021-07-22 NOTE — Care Management Important Message (Signed)
Important Message  Patient Details  Name: MURL GOLLADAY MRN: [id number] Date of Birth: 1927-02-27   Medicare Important Message Given:  N/A - LOS <3 / Initial given by admissions     Dannette Barbara 07/22/2021, 2:54 PM

## 2021-07-22 NOTE — Consult Note (Signed)
Cardiology Consult    Patient ID: Anthony Johns MRN: [id number], DOB/AGE: Sep 04, 1927   Admit date: 07/21/2021 Date of Consult: 07/22/2021  Primary Physician: Anthony Hire, MD Primary Cardiologist: Anthony Sacramento, MD Requesting Provider: Marlowe Sax, MD  Patient Profile    Anthony Johns is a 88 y.o. male with a history of CAD s/p multiple PCI's w/ known CTO of the LCX, COPD, bilateral carotid dzs, HFpEF, chronic venous insuff, HTN, HL, and hiatal hernia, who is being seen today for the evaluation of NSTEMI in the setting of rhabdomyolysis at the request of Dr. Marlowe Johns.  Past Medical History   Past Medical History:  Diagnosis Date   (HFpEF) heart failure with preserved ejection fraction ([place])    a. 06/2017 Echo: EF 60-65%, no rwma, Gr2 DD, mild MR, nl LA size, nl RV fxn, PASP wnl; b. 07/2017 Echo: EF 60-65%, no rwma, GrII DD, mild MR, nl RV fxn.   Carotid arterial disease ([place])    a. 01/2020 U/S: RICA 8-85%, RECA >02%, LICA 77-41%.   Coronary artery disease    a. He reports having myocardial infarction at the age of 32. He reports having multiple PCI's; b. 03/2008 Cath/PCI: LAD mild dzs, LCX 154m RCA 85d (3.0x18 Vision BMS); c. 07/2016 MV: EF 55-65%, mid inf/inflat infarct w/o ischemia; d. 08/2017 MV: EF 55-65%, no isch/infarct.   Essential hypertension    Hiatal hernia    Hypercalcemia    Hyperlipidemia    Hypertension    Ischemic heart disease    [place] ([place]ocardial infarction) ([place]     Past Surgical History:  Procedure Laterality Date   CARDIAC CATHETERIZATION     [facility]   CORONARY ANGIOPLASTY WITH STENT PLACEMENT       Allergies  Allergies  Allergen Reactions   Tape     History of Present Illness    88y.o. male with a history of CAD, COPD, bilateral carotid dzs, HFpEF, chronic venous insuff, HTN, HL, and hiatal hernia.  Anthony Johns suffered an MI and underwent multiple PCI's, including BMS to the RCA in Feb 2010.  He has a known occlusion of the LCX.  Stress  testing in June 2018, showed mid inferior/inferolateral infarct w/o ischemia.  [facility] HsTrop.  Echo showed EF of 60-65% w/ grade II diast dysfxn.  Subsequent myoview in 08/2017 showed no ischemia/infarct w/ nl LV fxn, and he has since been medically managed.  He was last seen in cardiology clinic in 11/2019, at which time he reported stable angina, and he was placed on imdur 15 mg daily.  Since then, Anthony Johns that he "fired" all of his doctors with the exception of his PCP.  He has continued to have occasional chest discomfort, typically at rest as he exerts very little, lasting a varied amount of time (he is not specific re: frequency or duration of symptoms).  He takes a sl ntg at least 1x/month.  He also notes chronic DOE, which he notices most when going up and down the stairs in his home.  Though exact details of [facility] aren't clear, pts dtr notes that she last contact w/ Mr. Anthony Johns Sunday 5/28.  Neighbors saw lights on in his home on Tues 5/29.  Neighbors did not see him open his shades on 5/31, and contacted his dtr, who was planning to go to his home to drop  off food @ ~ 16:30 anyway.  Upon her arrival, she found him lying in the middle of his hallway wearing only a t-shirt.  A pair of boxer shorts were nearby and he was surrounded by dried stool and urine.  She noted that there was nothing in the kitchen to indicate that he tried to make bfast that morning, and he wasn't able to clearly tell her what had happened.  She presumed that he fell sometime prior to Wednesday morning, and perhaps wasn't able to get up.  Pt says that he sat on the floor just prior to her arrival and denies incontinence of stool/urine.  Dtr noted that he was hallucinating and reported that other people were in the house w/ them, but no one was there.  EMS was called and he was taken [facility] ED.  Here, ECG w/ sinus  rhythm @ 100, LBBB (QRS wider than 11/2019 ECG).  Labs notable for BNP of 1373, CK 3433 .arow 1800, HsTrop 525, lactic acid 3.1  1.5, WBC 14.9  11.7.  Initial CXR w/o acute findings.  CT Abd/pelvis (reported abd discomfort) showed mod sized hiatal hernia, fatty liver, splenomegaly, diverticulosis, BPH, and gallbladder distension.  CT head w/o acute findings.  He was placed on IVF and heparin.  F/u HsTrop this AM higher @ 5944.  On exam this AM, he had noticeable increased work of breathing as well as audible exp wheezing.  IVF d/c'd and he was given albuterol neb and IV lasix w/ subsequent improvement in resp status.  He isn't sure if he's had any recent increase of chest pain frequency/severity but currently denies chest pain.  Inpatient Medications     aspirin EC  325 mg Oral Daily   carvedilol  6.25 mg Oral BID   isosorbide mononitrate  15 mg Oral Daily   pantoprazole  40 mg Oral Q supper   sertraline  50 mg Oral Daily   cyanocobalamin  1,000 mcg Oral Daily    Family History    Family History  Problem Relation Age of Onset   Hypertension Father    He indicated that his mother is deceased. He indicated that his father is deceased.   Social History    Social History   Socioeconomic History   Marital status: Widowed    Spouse name: Not on file   Number of children: Not on file   Years of education: Not on file   Highest education level: Not on file  Occupational History   Not on file  Tobacco Use   Smoking status: Former    Types: Cigarettes    Quit date: 03/25/1982    Years since quitting: 39.3   Smokeless tobacco: Never  Vaping Use   Vaping Use: Never used  Substance and Sexual Activity   Alcohol use: Yes    Comment: occas.    Drug use: No   Sexual activity: Not on file  Other Topics Concern   Not on file  Social History Narrative   Pt lives alone in [place] w/ family nearby that checks in often and provides meals.  He still drives some but is otw fairly sedentary.   He owns guns, which he calls his 'toys' and keeps in his basement.  He enjoys 'playing' with them.   Social Determinants of Health   Financial Resource Strain: Not on file  Food Insecurity: Not on file  Transportation Needs: Not on file  Physical Activity: Not on file  Stress: Not on file  Social Connections: Not on file  Intimate Partner Violence: Not on file     Review of Systems    General:  No chills, fever, night sweats or weight changes.  Cardiovascular:  +++ occas chest pain, +++ dyspnea on exertion, +++ occas R>L LE edema, no orthopnea, palpitations, paroxysmal nocturnal dyspnea. Dermatological: No rash, lesions/masses Respiratory: No cough, +++ dyspnea Urologic: No hematuria, dysuria Abdominal:   No nausea, vomiting, diarrhea, bright red blood per rectum, melena, or hematemesis Neurologic:  No visual changes, wkns, changes in mental status. All other systems reviewed and are otherwise negative except as noted above.  Physical Exam    Blood pressure 103/69, pulse 90, temperature 98.1 F (36.7 C), temperature source Oral, resp. rate (!) 24, height '5\' 6"'$  (1.676 m), weight 78.8 kg, SpO2 94 %.  General: Pleasant. Psych: Normal affect. Neuro: Disoriented to time/place.  Moves all extremities spontaneously. HEENT: Normal  Neck: Supple without bruits.  Mod elev JVP. Lungs:  Inc wob on initial exam.  Markedly diminished breath sounds throughout - more so at bases. Heart: Distant, tachy, RRR, no s3, s4, or murmurs. Abdomen: Soft, non-tender, non-distended, BS + x 4.  Extremities: No clubbing, cyanosis or edema. DP/PT2+, Radials 2+ and equal bilaterally.  Labs    Cardiac Enzymes Recent Labs  Lab 07/21/21 1739 07/22/21 0712  TROPONINIHS 525* 5,944*     BNP    Component Value Date/Time   BNP 1,373.1 (H) 07/21/2021 1739   Lab Results  Component Value Date   WBC 11.7 (H) 07/22/2021   HGB 11.9 (L) 07/22/2021   HCT 38.0 (L) 07/22/2021   MCV 87.4 07/22/2021   PLT 148  (L) 07/22/2021    Recent Labs  Lab 07/21/21 1739 07/22/21 0712  NA 137 140  K 4.2 4.2  CL 102 109  CO2 23 21*  BUN 19 24*  CREATININE 0.99 1.06  CALCIUM 9.6 9.0  PROT 8.0  --   BILITOT 2.5*  --   ALKPHOS 80  --   ALT 33  --   AST 101*  --   GLUCOSE 115* 109*     Radiology Studies    CT HEAD WO CONTRAST (5MM)  Result Date: 07/21/2021 CLINICAL DATA:  Altered mental status, patient found down on floor. EXAM: CT HEAD WITHOUT CONTRAST TECHNIQUE: Contiguous axial images were obtained from the base of the skull through the vertex without intravenous contrast. RADIATION DOSE REDUCTION: This exam was performed according to the departmental dose-optimization program which includes automated exposure control, adjustment of the mA and/or kV according to patient size and/or use of iterative reconstruction technique. COMPARISON:  None Available. FINDINGS: Brain: Remote lacunar infarcts of the left caudate head and right caudate body. Periventricular white matter and corona radiata hypodensities favor chronic ischemic microvascular white matter disease. Otherwise, the brainstem, cerebellum, cerebral peduncles, thalamus, basal ganglia, basilar cisterns, and ventricular system appear within normal limits. No intracranial hemorrhage, mass lesion, or acute CVA. Vascular: There is atherosclerotic calcification of the cavernous carotid arteries bilaterally. Skull: Unremarkable Sinuses/Orbits: Mild chronic ethmoid and right frontal sinusitis. Other: No supplemental non-categorized findings. IMPRESSION: 1. No acute intracranial findings. 2. Remote lacunar infarcts of the caudate nuclei. 3. Periventricular white matter and corona radiata hypodensities favor chronic ischemic microvascular white matter disease. 4. Atherosclerosis. 5. Mild chronic ethmoid and right frontal sinusitis. Electronically Signed   By: Van Clines M.D.   On: 07/21/2021 18:50   CT ABDOMEN PELVIS W CONTRAST  Result Date:  07/21/2021 CLINICAL DATA:  Trauma, fall  EXAM: CT ABDOMEN AND PELVIS WITH CONTRAST TECHNIQUE: Multidetector CT imaging of the abdomen and pelvis was performed using the standard protocol following bolus administration of intravenous contrast. RADIATION DOSE REDUCTION: This exam was performed according to the departmental dose-optimization program which includes automated exposure control, adjustment of the mA and/or kV according to patient size and/or use of iterative reconstruction technique. CONTRAST:  16m OMNIPAQUE IOHEXOL 300 MG/ML  SOLN COMPARISON:  None Available. FINDINGS: Lower chest: There are few small nodular densities in both lower lung fields largest measuring 10 mm in size in the left lower lobe. These nodules have been seen in the previous examinations dating as far back as 05/25/2017. Nodule in the left lower lobe measures minimally larger. Coronary artery calcifications are seen. Hepatobiliary: There is fatty infiltration in the liver. Gallbladder is distended. There is possible minimal amount of fluid adjacent to the inferior margin of the liver. There is slight motion artifact adjacent to the liver and gallbladder limiting evaluation. In the delayed images, there is no definite wall thickening in gallbladder and there is no definite evidence of any fluid adjacent to the liver. Pancreas: There is pancreatic atrophy. No focal abnormality is seen. Spleen: Spleen is enlarged measuring 14.2 cm in maximum diameter. Adrenals/Urinary Tract: Nodularity seen in both adrenals has not changed significantly since 11/14/2019. There is no hydronephrosis. There are no renal or ureteral stones. Urinary bladder is unremarkable. Stomach/Bowel: There is moderate sized hiatal hernia. Stomach is not distended. Small bowel loops are not dilated. Appendix is not distinctly seen. There is no pericecal inflammation. There is no significant wall thickening in colon. There is no pericolic stranding. Scattered diverticula  are seen in colon without signs of focal diverticulitis. Vascular/Lymphatic: Arterial calcifications are seen. Reproductive: Prostate is enlarged with inhomogeneous attenuation. Other: There is no ascites or pneumoperitoneum. Left inguinal hernia containing fat is seen. Musculoskeletal: Degenerative changes are noted in the lumbar spine with disc space narrowing, bony spurs and facet hypertrophy. IMPRESSION: There is no evidence of intestinal obstruction or pneumoperitoneum. There is no hydronephrosis. There are few nodules in both lower lung fields. Nodule in the left lower lobe appears minimally more prominent. However, the nodules have been noted in the previous studies dating as far back as 05/25/2017, most likely suggesting benign etiology. Fatty liver. Moderate sized hiatal hernia. Enlarged spleen. Diverticulosis of colon without signs of diverticulitis. Enlarged prostate. Gallbladder is distended without definite wall thickening. There is no dilation of bile ducts. In the some of the images motion artifacts limit evaluation of liver and gallbladder. If there are any focal symptoms in the right upper quadrant, follow-up sonogram may be considered. Other findings as described in the body of the report. Electronically Signed   By: PElmer PickerM.D.   On: 07/21/2021 21:04   DG Chest Port 1 View  Result Date: 07/22/2021 CLINICAL DATA:  Shortness of breath EXAM: PORTABLE CHEST 1 VIEW COMPARISON:  Chest x-ray dated Jul 21, 2021 FINDINGS: Cardiac and mediastinal contours are unchanged. New bilateral heterogeneous opacities with more focal consolidation of the right lower lobe. No large pleural effusion or pneumothorax. IMPRESSION: New bilateral heterogeneous opacities with more focal consolidation of the right lower lobe, differential considerations include pulmonary edema or infection. Electronically Signed   By: LYetta GlassmanM.D.   On: 07/22/2021 09:45   DG Chest Port 1 View  Result Date:  07/21/2021 CLINICAL DATA:  [id number] AMS; Questionable sepsis; [facility] home for fall. Pt was found on the  floor this this afternoon by family, unknown when he fell. Non of his morning medications were taken and walker was tipped over in the floor. EXAM: PORTABLE CHEST 1 VIEW COMPARISON:  CT chest 11/14/2019, chest x-ray 07/30/2017 FINDINGS: The heart and mediastinal contours are unchanged. Aortic calcification. No definite focal consolidation. Biapical pleural/pulmonary scarring. Diffuse chronic interstitial markings with no overt pulmonary edema. No pleural effusion. No pneumothorax. No acute osseous abnormality. IMPRESSION: 1. No active disease. 2. Aortic Atherosclerosis (ICD10-I70.0) and Emphysema (ICD10-J43.9). Electronically Signed   By: Iven Finn M.D.   On: 07/21/2021 18:19    ECG & Cardiac Imaging    RSR, 100, LBBB - personally reviewed.  Assessment & Plan    1.  NSTEMI/CAD:  Pt w/ h/o CAD s/p prior RCA stenting in 2010 w/ known CTO of the LCX.  Low risk MV's in 2018 and 2019.  He notes some degree of chronic angina, which appears to occur @ rest, perhaps as frequently as a few x/month, for which he sometimes uses sl NTG.  He was admitted 5/31 after being found on the floor by his daughter w/ AMS and incontinence of bowel/bladder.  Initial labs suggested rhabdo w/ CK of 3433.  In that setting, HsTrop elevate @ 525.  F/u HsTrop however is much higher @ 5944.  On further questioning, pt indicates that he has chronic DOE, which may have been worse over the past few days.  On arrival to see pt this AM, he was noticeably short of breath with expiratory wheezing.  He was given a DuoNeb nebulizer and also intravenous Lasix.  Follow-up chest x-ray did show pulmonary edema.  Discussed with family at length.  Follow-up echocardiogram today.  Continue aspirin, heparin, high potency statin, nitrate, and beta-blocker therapy.  We will keep n.p.o. after midnight and consider diagnostic  catheterization tomorrow.  2.  Acute congestive heart failure: Patient with history of normal LV function but presents with non-STEMI in the setting of rhabdomyolysis and altered mental status.  As above, noted to be dyspneic this morning with JVD, diminished breath sounds, and pulmonary edema noted on chest x-ray.  He received a dose of IV Lasix earlier in provide another dose this evening.  Follow-up echo.  Plan on diagnostic catheterization as above.  Continue beta-blocker.  Hold off on acei/arb/arni/mra/sglt2i at least until we see how he responds to diuresis.  3.  Essential hypertension: Blood pressures have been stable.  Follow.  4.  Hyperlipidemia: Adding high potency statin therapy in the setting of non-STEMI.  5.  Rhabdomyolysis: Unclear duration of time on the floor.  Presumed to be greater than 12 hours.  We discontinued intravenous fluids in the setting of acute heart failure.  CK coming down.  Risk Assessment/Risk Scores:     TIMI Risk Score for Unstable Angina or Non-ST Elevation MI:   The patient's TIMI risk score is 5, which indicates a 26% risk of all cause mortality, new or recurrent myocardial infarction or need for urgent revascularization in the next 14 days.  New York Heart Association (NYHA) Functional Class NYHA Class IV    Signed, Murray Hodgkins, NP 07/22/2021, 1:23 PM  For questions or updates, please contact   Please consult [email or web address] for contact info under Cardiology/STEMI.

## 2021-07-22 NOTE — Progress Notes (Signed)
Progress Note Patient: Anthony Johns ELF:[id number] DOB: 1927/08/08 DOA: 07/21/2021  DOS: the patient was seen and examined on 07/22/2021  Brief hospital course: Possible history of CAD, HTN, HFpEF, HLD presented to the hospital with an unwitnessed fall where the patient describes that he lowered himself to the ground but unknown downtime.  Found to have elevated CK as well as troponin which trended up significantly.  Cardiology consulted.  Found to have acute systolic CHF.  Plan for possibly cardiac catheterization on 6/2. Assessment and Plan: * Non-STEMI (non-ST elevated myocardial infarction) (HCC) Acute systolic and diastolic CHF. Appears to have some shortness of breath.  Patient's with an unwitnessed fall. Troponin went from [phone]. Cardiology consulted. EKG unremarkable for any acute ischemia. Concern for NSTEMI.  Echocardiogram shows a significant drop in EF from normal to 30 to 35% with global hypokinesis and moderate MR. Possible cardiac catheterization tomorrow as long as his respiratory status improves. Currently on IV heparin and aspirin. Continue Coreg as well as Lipitor. Initially received IV fluids for concern for rhabdomyolysis, now receiving IV Lasix. I have discontinued CT PE protocol  to avoid excessive contrast as this appears to be mostly cardiac presentation  Rhabdomyolysis Unknown downtime. Patient tells me that he actually lowered himself to the ground rather than having a fall. CK is not significantly elevated. Agree with discontinuing the fluids.   Occasional tremors Per family patient does have some tremors at his baseline.  Etiology of this tremors have not been identified.  Recommend outpatient work-up.  Acute metabolic encephalopathy Somewhat confused and poor historian. Unsure what is the baseline. Monitor for now.  Unwitnessed fall Suspect multifactorial fall.  PT OT will be consulted  Depression Continue Zoloft. On trazodone  nightly.  Essential hypertension Patient is on multiple antihypertensive medication at home. Currently continuing Imdur, Coreg, Lasix. Holding losartan and Norvasc. Given low EF may benefit from transitioning to ARN I, will defer to cardiology.  Moderate hiatal hernia Incidentally seen on the CT scan.  Monitor.  GERD without esophagitis - Continue PPI therapy.  Hyperlipidemia Continue statin  Subjective: Continues to have shortness of breath.  No nausea no vomiting.  Poor historian therefore unable to specify whether he actually has chest pain right now or not.  Despite multiple questions.  Had some abdominal pain yesterday but no pain right now.  Physical Exam: Vitals:   07/22/21 1045 07/22/21 1306 07/22/21 1547 07/22/21 1617  BP: 111/66 103/69 (!) 159/124 (!) 144/96  Pulse: 62 90 93   Resp: (!) 23 (!) 24 19   Temp:   98.4 F (36.9 C)   TempSrc:   Axillary   SpO2: 98% 94% 94%   Weight:   76.6 kg   Height:   '5\' 6"'$  (1.676 m)    General: Appear in mild distress; no visible Abnormal Neck Mass Or lumps, Conjunctiva normal Cardiovascular: S1 and S2 Present, no Murmur, Respiratory: increased respiratory effort, Bilateral Air entry present and  bilateral Crackles, Occasional upper airway wheezes Abdomen: Bowel Sound present, Non tender  Extremities: no Pedal edema Neurology: alert and oriented to time, place, and person  Gait not checked due to patient safety concerns   Data Reviewed: I have Reviewed nursing notes, Vitals, and Lab results since pt's last encounter. Pertinent lab results CBC and BMP, troponin I have ordered test including CBC and BMP I have discussed pt's care plan and test results with cardiology.   Family Communication: Family at bedside  Disposition: Status is: Inpatient Remains inpatient appropriate because:  Concern for non-STEMI receiving IV heparin therapy  Author: Berle Mull, MD 07/22/2021 5:03 PM  Please look on [email or web address] to find out who is  on call.

## 2021-07-22 NOTE — Assessment & Plan Note (Addendum)
Unknown downtime. Patient tells me that he actually lowered himself to the ground rather than having a fall. CK is not significantly elevated.

## 2021-07-22 NOTE — Assessment & Plan Note (Addendum)
Per family patient does have some tremors at his baseline.  Recommend outpatient work-up.

## 2021-07-22 NOTE — Progress Notes (Signed)
*  PRELIMINARY RESULTS* Echocardiogram 2D Echocardiogram has been performed.  Anthony Johns 07/22/2021, 3:04 PM

## 2021-07-22 NOTE — Assessment & Plan Note (Signed)
Suspect multifactorial fall.  PT recommends home with home health, patient able to ambulate 150 feet.

## 2021-07-23 ENCOUNTER — Encounter
Admission: EM | Disposition: A | Discharge status: Hospice | Payer: Self-pay | Source: Home / Self Care | Attending: Internal Medicine

## 2021-07-23 DIAGNOSIS — I5041 Acute combined systolic (congestive) and diastolic (congestive) heart failure: Secondary | ICD-10-CM

## 2021-07-23 DIAGNOSIS — I2510 Atherosclerotic heart disease of native coronary artery without angina pectoris: Secondary | ICD-10-CM

## 2021-07-23 DIAGNOSIS — I214 Non-ST elevation (NSTEMI) myocardial infarction: Secondary | ICD-10-CM | POA: Diagnosis not present

## 2021-07-23 HISTORY — PX: RIGHT/LEFT HEART CATH AND CORONARY ANGIOGRAPHY: CATH118266

## 2021-07-23 LAB — CBC
HCT: 32.9 % — ABNORMAL LOW (ref 39.0–52.0)
Hemoglobin: 10.4 g/dL — ABNORMAL LOW (ref 13.0–17.0)
MCH: 26.9 pg (ref 26.0–34.0)
MCHC: 31.6 g/dL (ref 30.0–36.0)
MCV: 85 fL (ref 80.0–100.0)
Platelets: 125 10*3/uL — ABNORMAL LOW (ref 150–400)
RBC: 3.87 MIL/uL — ABNORMAL LOW (ref 4.22–5.81)
RDW: 13.4 % (ref 11.5–15.5)
WBC: 7.7 10*3/uL (ref 4.0–10.5)
nRBC: 0 % (ref 0.0–0.2)

## 2021-07-23 LAB — BASIC METABOLIC PANEL
Anion gap: 8 (ref 5–15)
BUN: 36 mg/dL — ABNORMAL HIGH (ref 8–23)
CO2: 23 mmol/L (ref 22–32)
Calcium: 8.9 mg/dL (ref 8.9–10.3)
Chloride: 107 mmol/L (ref 98–111)
Creatinine, Ser: 1.1 mg/dL (ref 0.61–1.24)
GFR, Estimated: >60 mL/min (ref >60)
Glucose, Bld: 101 mg/dL — ABNORMAL HIGH (ref 70–99)
Potassium: 3.4 mmol/L — ABNORMAL LOW (ref 3.5–5.1)
Sodium: 138 mmol/L (ref 135–145)

## 2021-07-23 LAB — HEPARIN LEVEL (UNFRACTIONATED): Heparin Unfractionated: 0.37 IU/mL (ref 0.30–0.70)

## 2021-07-23 LAB — CK: Total CK: 970 U/L — ABNORMAL HIGH (ref 49–397)

## 2021-07-23 SURGERY — RIGHT/LEFT HEART CATH AND CORONARY ANGIOGRAPHY
Anesthesia: Moderate Sedation

## 2021-07-23 MED ORDER — LIDOCAINE HCL 1 % IJ SOLN
INTRAMUSCULAR | Status: AC
  Filled 2021-07-23: qty 20

## 2021-07-23 MED ORDER — SODIUM CHLORIDE 0.9% FLUSH: 3.0000 mL | INTRAVENOUS | Status: DC | PRN

## 2021-07-23 MED ORDER — ORAL CARE MOUTH RINSE
15.0000 mL | Freq: Two times a day (BID) | OROMUCOSAL | Status: DC
  Administered 2021-07-23 – 2021-07-28 (×10): 15 mL via OROMUCOSAL

## 2021-07-23 MED ORDER — HEPARIN SODIUM (PORCINE) 1000 UNIT/ML IJ SOLN
INTRAMUSCULAR | Status: DC | PRN
  Administered 2021-07-23: 3500 [IU] via INTRAVENOUS

## 2021-07-23 MED ORDER — POTASSIUM CHLORIDE CRYS ER 20 MEQ PO TBCR
40.0000 meq | EXTENDED_RELEASE_TABLET | Freq: Once | ORAL | Status: AC
  Administered 2021-07-23: 40 meq via ORAL
  Filled 2021-07-23: qty 2

## 2021-07-23 MED ORDER — FENTANYL CITRATE (PF) 100 MCG/2ML IJ SOLN
INTRAMUSCULAR | Status: AC
  Filled 2021-07-23: qty 2

## 2021-07-23 MED ORDER — MIDAZOLAM HCL 2 MG/2ML IJ SOLN
INTRAMUSCULAR | Status: DC | PRN
  Administered 2021-07-23: .5 mg via INTRAVENOUS

## 2021-07-23 MED ORDER — FENTANYL CITRATE (PF) 100 MCG/2ML IJ SOLN
INTRAMUSCULAR | Status: DC | PRN
Start: 2021-07-23 — End: 2021-07-23
  Administered 2021-07-23: 12.5 ug via INTRAVENOUS

## 2021-07-23 MED ORDER — HEPARIN SODIUM (PORCINE) 1000 UNIT/ML IJ SOLN
INTRAMUSCULAR | Status: AC
  Filled 2021-07-23: qty 10

## 2021-07-23 MED ORDER — SODIUM CHLORIDE 0.9 % IV SOLN: 250.0000 mL | INTRAVENOUS | Status: DC | PRN

## 2021-07-23 MED ORDER — IOHEXOL 300 MG/ML  SOLN
INTRAMUSCULAR | Status: DC | PRN
  Administered 2021-07-23: 45 mL

## 2021-07-23 MED ORDER — VERAPAMIL HCL 2.5 MG/ML IV SOLN
INTRAVENOUS | Status: DC | PRN
  Administered 2021-07-23: 2.5 mg via INTRA_ARTERIAL

## 2021-07-23 MED ORDER — SENNOSIDES-DOCUSATE SODIUM 8.6-50 MG PO TABS
1.0000 | ORAL_TABLET | Freq: Two times a day (BID) | ORAL | Status: DC
  Administered 2021-07-23 – 2021-07-27 (×9): 1 via ORAL
  Filled 2021-07-23 (×10): qty 1

## 2021-07-23 MED ORDER — HEPARIN (PORCINE) IN NACL 2000-0.9 UNIT/L-% IV SOLN
INTRAVENOUS | Status: DC | PRN
  Administered 2021-07-23: 1000 mL

## 2021-07-23 MED ORDER — FUROSEMIDE 40 MG PO TABS
40.0000 mg | ORAL_TABLET | Freq: Two times a day (BID) | ORAL | Status: DC
  Administered 2021-07-23 – 2021-07-24 (×2): 40 mg via ORAL
  Filled 2021-07-23 (×2): qty 1

## 2021-07-23 MED ORDER — SODIUM CHLORIDE 0.9% FLUSH
3.0000 mL | Freq: Two times a day (BID) | INTRAVENOUS | Status: DC
  Administered 2021-07-23 – 2021-07-28 (×9): 3 mL via INTRAVENOUS

## 2021-07-23 MED ORDER — HEPARIN (PORCINE) IN NACL 1000-0.9 UT/500ML-% IV SOLN
INTRAVENOUS | Status: AC
  Filled 2021-07-23: qty 1000

## 2021-07-23 MED ORDER — CLOPIDOGREL BISULFATE 75 MG PO TABS
75.0000 mg | ORAL_TABLET | Freq: Every day | ORAL | Status: DC
  Administered 2021-07-24 – 2021-07-28 (×5): 75 mg via ORAL
  Filled 2021-07-23 (×5): qty 1

## 2021-07-23 MED ORDER — MIDAZOLAM HCL 2 MG/2ML IJ SOLN
INTRAMUSCULAR | Status: AC
  Filled 2021-07-23: qty 2

## 2021-07-23 MED ORDER — SODIUM CHLORIDE 0.9 % IV SOLN: INTRAVENOUS | Status: DC

## 2021-07-23 MED ORDER — FUROSEMIDE 10 MG/ML IJ SOLN
40.0000 mg | Freq: Two times a day (BID) | INTRAMUSCULAR | Status: DC
Start: 2021-07-23 — End: 2021-07-23
  Administered 2021-07-23: 40 mg via INTRAVENOUS
  Filled 2021-07-23: qty 4

## 2021-07-23 MED ORDER — SODIUM CHLORIDE 0.9% FLUSH
3.0000 mL | Freq: Two times a day (BID) | INTRAVENOUS | Status: DC
  Administered 2021-07-23 – 2021-07-28 (×7): 3 mL via INTRAVENOUS

## 2021-07-23 MED ORDER — VERAPAMIL HCL 2.5 MG/ML IV SOLN
INTRAVENOUS | Status: AC
  Filled 2021-07-23: qty 2

## 2021-07-23 SURGICAL SUPPLY — 14 items
BAND ZEPHYR COMPRESS 30 LONG (HEMOSTASIS) ×1 IMPLANT
CATH BALLN WEDGE 5F 110CM (CATHETERS) ×1 IMPLANT
CATH INFINITI 5FR JK (CATHETERS) ×1 IMPLANT
CATH INFINITI JR4 5F (CATHETERS) ×1 IMPLANT
DRAPE BRACHIAL (DRAPES) ×2 IMPLANT
GLIDESHEATH SLEND SS 6F .021 (SHEATH) ×1 IMPLANT
GUIDEWIRE INQWIRE 1.5J.035X260 (WIRE) IMPLANT
INQWIRE 1.5J .035X260CM (WIRE) ×2
PACK CARDIAC CATH (CUSTOM PROCEDURE TRAY) ×2 IMPLANT
PROTECTION STATION PRESSURIZED (MISCELLANEOUS) ×2
SET ATX SIMPLICITY (MISCELLANEOUS) ×1 IMPLANT
SHEATH 6FR 85 DEST SLENDER (SHEATH) ×1 IMPLANT
SHEATH GLIDE SLENDER 4/5FR (SHEATH) ×1 IMPLANT
STATION PROTECTION PRESSURIZED (MISCELLANEOUS) IMPLANT

## 2021-07-23 NOTE — Progress Notes (Addendum)
Cardiology Progress Note   Patient Name: Anthony Johns Date of Encounter: 07/23/2021  Primary Cardiologist: Kathlyn Sacramento, MD  Subjective   Denies chest pain.  Noted significant dyspnea when up walking to bathroom this AM.  Still mildly dyspneic with speech.  Family at bedside.  Pt/family wish to pursue cath.  Inpatient Medications    Scheduled Meds:  aspirin  81 mg Oral Daily   atorvastatin  80 mg Oral Daily   carvedilol  6.25 mg Oral BID   isosorbide mononitrate  15 mg Oral Daily   mouth rinse  15 mL Mouth Rinse BID   pantoprazole  40 mg Oral Q supper   sertraline  50 mg Oral Daily   cyanocobalamin  1,000 mcg Oral Daily   Continuous Infusions:  heparin 1,200 Units/hr (07/22/21 1736)   PRN Meds: acetaminophen **OR** acetaminophen, ipratropium-albuterol, morphine injection, nitroGLYCERIN, ondansetron **OR** ondansetron (ZOFRAN) IV, traZODone   Vital Signs    Vitals:   07/23/21 0011 07/23/21 0414 07/23/21 0735 07/23/21 0819  BP: (!) 104/57 (!) 107/59 107/63 113/64  Pulse: 76  73 70  Resp: '18 16 18 18  '$ Temp: 98.1 F (36.7 C) 98.3 F (36.8 C) 98.3 F (36.8 C) 98 F (36.7 C)  TempSrc: Oral Oral    SpO2: 99% 96% 96% 96%  Weight:      Height:        Intake/Output Summary (Last 24 hours) at 07/23/2021 0916 Last data filed at 07/23/2021 0448 Gross per 24 hour  Intake 439.33 ml  Output 250 ml  Net 189.33 ml   Filed Weights   07/21/21 2119 07/22/21 1547  Weight: 78.8 kg 76.6 kg    Physical Exam   GEN: Well nourished, well developed, in no acute distress.  HEENT: Grossly normal.  Neck: Supple, mod elev JVP, no carotid bruits, or masses. Cardiac: RRR, no murmurs, rubs, or gallops. No clubbing, cyanosis, edema.  Radials 2+, DP/PT 2+ and equal bilaterally.  Respiratory:  Respirations regular and unlabored, crackles 1/3 way up bilat. GI: Soft, nontender, nondistended, BS + x 4. MS: no deformity or atrophy. Skin: warm and dry, no rash. Neuro:  Strength and  sensation are intact. Psych: AAOx3.  Normal affect.  Labs    Chemistry Recent Labs  Lab 07/21/21 1739 07/22/21 0712 07/23/21 0658  NA 137 140 138  K 4.2 4.2 3.4*  CL 102 109 107  CO2 23 21* 23  GLUCOSE 115* 109* 101*  BUN 19 24* 36*  CREATININE 0.99 1.06 1.10  CALCIUM 9.6 9.0 8.9  PROT 8.0  --   --   ALBUMIN 3.7  --   --   AST 101*  --   --   ALT 33  --   --   ALKPHOS 80  --   --   BILITOT 2.5*  --   --   GFRNONAA >60 >60 >60  ANIONGAP '12 10 8     '$ Hematology Recent Labs  Lab 07/21/21 1739 07/22/21 0712 07/23/21 0658  WBC 14.9* 11.7* 7.7  RBC 4.92 4.35 3.87*  HGB 13.3 11.9* 10.4*  HCT 42.8 38.0* 32.9*  MCV 87.0 87.4 85.0  MCH 27.0 27.4 26.9  MCHC 31.1 31.3 31.6  RDW 13.4 13.6 13.4  PLT 202 148* 125*    Cardiac Enzymes  Recent Labs  Lab 07/21/21 1739 07/22/21 0712 07/22/21 1711  TROPONINIHS 525* 5,944* 10,784*      BNP    Component Value Date/Time   BNP 1,373.1 (H) 07/21/2021 1739  Radiology    CT HEAD WO CONTRAST (5MM)  Result Date: 07/21/2021 CLINICAL DATA:  Altered mental status, patient found down on floor. EXAM: CT HEAD WITHOUT CONTRAST TECHNIQUE: Contiguous axial images were obtained from the base of the skull through the vertex without intravenous contrast. RADIATION DOSE REDUCTION: This exam was performed according to the departmental dose-optimization program which includes automated exposure control, adjustment of the mA and/or kV according to patient size and/or use of iterative reconstruction technique. COMPARISON:  None Available. FINDINGS: Brain: Remote lacunar infarcts of the left caudate head and right caudate body. Periventricular white matter and corona radiata hypodensities favor chronic ischemic microvascular white matter disease. Otherwise, the brainstem, cerebellum, cerebral peduncles, thalamus, basal ganglia, basilar cisterns, and ventricular system appear within normal limits. No intracranial hemorrhage, mass lesion, or acute  CVA. Vascular: There is atherosclerotic calcification of the cavernous carotid arteries bilaterally. Skull: Unremarkable Sinuses/Orbits: Mild chronic ethmoid and right frontal sinusitis. Other: No supplemental non-categorized findings. IMPRESSION: 1. No acute intracranial findings. 2. Remote lacunar infarcts of the caudate nuclei. 3. Periventricular white matter and corona radiata hypodensities favor chronic ischemic microvascular white matter disease. 4. Atherosclerosis. 5. Mild chronic ethmoid and right frontal sinusitis. Electronically Signed   By: Van Clines M.D.   On: 07/21/2021 18:50   CT ABDOMEN PELVIS W CONTRAST  Result Date: 07/21/2021 CLINICAL DATA:  Trauma, fall EXAM: CT ABDOMEN AND PELVIS WITH CONTRAST TECHNIQUE: Multidetector CT imaging of the abdomen and pelvis was performed using the standard protocol following bolus administration of intravenous contrast. RADIATION DOSE REDUCTION: This exam was performed according to the departmental dose-optimization program which includes automated exposure control, adjustment of the mA and/or kV according to patient size and/or use of iterative reconstruction technique. CONTRAST:  148m OMNIPAQUE IOHEXOL 300 MG/ML  SOLN COMPARISON:  None Available. FINDINGS: Lower chest: There are few small nodular densities in both lower lung fields largest measuring 10 mm in size in the left lower lobe. These nodules have been seen in the previous examinations dating as far back as 05/25/2017. Nodule in the left lower lobe measures minimally larger. Coronary artery calcifications are seen. Hepatobiliary: There is fatty infiltration in the liver. Gallbladder is distended. There is possible minimal amount of fluid adjacent to the inferior margin of the liver. There is slight motion artifact adjacent to the liver and gallbladder limiting evaluation. In the delayed images, there is no definite wall thickening in gallbladder and there is no definite evidence of any fluid  adjacent to the liver. Pancreas: There is pancreatic atrophy. No focal abnormality is seen. Spleen: Spleen is enlarged measuring 14.2 cm in maximum diameter. Adrenals/Urinary Tract: Nodularity seen in both adrenals has not changed significantly since 11/14/2019. There is no hydronephrosis. There are no renal or ureteral stones. Urinary bladder is unremarkable. Stomach/Bowel: There is moderate sized hiatal hernia. Stomach is not distended. Small bowel loops are not dilated. Appendix is not distinctly seen. There is no pericecal inflammation. There is no significant wall thickening in colon. There is no pericolic stranding. Scattered diverticula are seen in colon without signs of focal diverticulitis. Vascular/Lymphatic: Arterial calcifications are seen. Reproductive: Prostate is enlarged with inhomogeneous attenuation. Other: There is no ascites or pneumoperitoneum. Left inguinal hernia containing fat is seen. Musculoskeletal: Degenerative changes are noted in the lumbar spine with disc space narrowing, bony spurs and facet hypertrophy. IMPRESSION: There is no evidence of intestinal obstruction or pneumoperitoneum. There is no hydronephrosis. There are few nodules in both lower lung fields. Nodule in the left lower  lobe appears minimally more prominent. However, the nodules have been noted in the previous studies dating as far back as 05/25/2017, most likely suggesting benign etiology. Fatty liver. Moderate sized hiatal hernia. Enlarged spleen. Diverticulosis of colon without signs of diverticulitis. Enlarged prostate. Gallbladder is distended without definite wall thickening. There is no dilation of bile ducts. In the some of the images motion artifacts limit evaluation of liver and gallbladder. If there are any focal symptoms in the right upper quadrant, follow-up sonogram may be considered. Other findings as described in the body of the report. Electronically Signed   By: Elmer Picker M.D.   On: 07/21/2021  21:04   DG Chest Port 1 View  Result Date: 07/22/2021 CLINICAL DATA:  Shortness of breath EXAM: PORTABLE CHEST 1 VIEW COMPARISON:  Chest x-ray dated Jul 21, 2021 FINDINGS: Cardiac and mediastinal contours are unchanged. New bilateral heterogeneous opacities with more focal consolidation of the right lower lobe. No large pleural effusion or pneumothorax. IMPRESSION: New bilateral heterogeneous opacities with more focal consolidation of the right lower lobe, differential considerations include pulmonary edema or infection. Electronically Signed   By: Yetta Glassman M.D.   On: 07/22/2021 09:45   DG Chest Port 1 View  Result Date: 07/21/2021 CLINICAL DATA:  [id number]. AMS; Questionable sepsis; Pt to ED via [facility] from home for fall. Pt was found on the floor this this afternoon by family, unknown when he fell. Non of his morning medications were taken and walker was tipped over in the floor. EXAM: PORTABLE CHEST 1 VIEW COMPARISON:  CT chest 11/14/2019, chest x-ray 07/30/2017 FINDINGS: The heart and mediastinal contours are unchanged. Aortic calcification. No definite focal consolidation. Biapical pleural/pulmonary scarring. Diffuse chronic interstitial markings with no overt pulmonary edema. No pleural effusion. No pneumothorax. No acute osseous abnormality. IMPRESSION: 1. No active disease. 2. Aortic Atherosclerosis (ICD10-I70.0) and Emphysema (ICD10-J43.9). Electronically Signed   By: Iven Finn M.D.   On: 07/21/2021 18:19   Telemetry    RSR, rare PVC/couplet - Personally Reviewed  Cardiac Studies   2D Echocardiogram 07/22/2021   1. Left ventricular ejection fraction, by estimation, is 30 to 35%. The  left ventricle has moderately decreased function. The left ventricle  demonstrates regional wall motion abnormalities (see scoring  diagram/findings for description). There is mild  left ventricular hypertrophy. Left ventricular diastolic parameters are  consistent with Grade II diastolic  dysfunction (pseudonormalization).   2. Right ventricular systolic function is mildly reduced. The right  ventricular size is normal. Tricuspid regurgitation signal is inadequate  for assessing PA pressure.   3. The mitral valve is abnormal. Mild to moderate mitral valve  regurgitation. No evidence of mitral stenosis. Moderate mitral annular  calcification.   4. The aortic valve was not well visualized. There is mild calcification  of the aortic valve. There is mild thickening of the aortic valve. Aortic  valve regurgitation is trivial.   5. The inferior vena cava is normal in size with <50% respiratory  variability, suggesting right atrial pressure of 8 mmHg.   Patient Profile     88 y.o. male with a history of CAD s/p multiple PCI's w/ known CTO of the LCX, COPD, bilateral carotid dzs, HFpEF, chronic venous insuff, HTN, HL, and hiatal hernia, who presented 5/31 w/ AMS and probable mild rhabdomyolysis after unknown duration of being on his floor at home, w/ subsequent finding of NSTEMI and HsTrop up to 10784.  EF 30-35% by echo (new).  Assessment & Plan    1.  NSTEMI/CAD:  H/o CAD w/ known CTO of the LCX and s/p multiple PCIs, most recently DES  RCA in 2010.  Lost to f/u over the past 2 yrs (he fired all of his doctor's except PCP per pt).  Presented 5/31 after daughter found him @ home on his floor surrounded by stool/urine and w/ AMS.  CK 1373 and initial HsTrop 525.  No chest pain.  Initial picture of rhabdo, however, subsequent HsTrops elevated to 5944  10784, and he developed pulm edema w/ IVF.  Echo w/ EF of 30-35%. ECG w/ h/o IVCD, now w/ LBBB.  At no point has he had c/p.  Still w/ some dyspnea and volume excess (see below), but able to lie relatively flat.  Cont asa, statin, ? blocker.  Will d/c long acting nitrate in absence of c/p and soft BPs.  Plan R & L heart cath today.  Discussed w/ pt and family in detail.  The patient understands that risks include but are not limited to stroke  (1 in 1000), death (1 in 45), kidney failure [usually temporary] (1 in 500), bleeding (1 in 200), allergic reaction [possibly serious] (1 in 200), and agrees to proceed.    2.  Acute systolic CHF/Presumed ICM:  Dyspnea and volume overload noted on monrning of 61 and he received 2 doses of IV lasix.  Echo w/ EF of 30-35%, mild LVH, GrII DD, mild-mod MR.  I/O inaccurate - spent much of the day in the ED yesterday.   Still sob w/ minimal activity and some effort w/ speech.  Bibasilar crackles and mod elev JVP.  Cont lasix 40 IV BID.  R/L heart cath this afternoon.  Will look to add ARB vs entresto +/- MRA/[id number] as renal fxn/BP allows.  3.  Essential HTN: Stable on ? blocker and nitrate.  4.  HL:  Cont high potency statin rx.  F/u lipids and LFTs.  5.  Rhabdomyolysis:   Unclear duration of time on the floor.  Presumed to be greater than 12 hours.  We discontinued intravenous fluids in the setting of acute heart failure.  CK coming down.  F/u lft's in am in setting of statin rx.  6.  Hypokalemia:  in setting of IV diuresis.  Potassium supplementation ordered.  Signed, Murray Hodgkins, NP  07/23/2021, 9:16 AM    For questions or updates, please contact   Please consult [email or web address] for contact info under Cardiology/STEMI.

## 2021-07-23 NOTE — Progress Notes (Signed)
Progress Note Patient: Anthony Johns:[id number] DOB: March 28, 1927 DOA: 07/21/2021  DOS: the patient was seen and examined on 07/23/2021  Brief hospital course: Possible history of CAD, HTN, HFpEF, HLD presented to the hospital with an unwitnessed fall where the patient describes that he lowered himself to the ground but unknown downtime.  Found to have elevated CK as well as troponin which trended up significantly.  Cardiology consulted.  Found to have acute systolic CHF.   Underwent cardiac catheterization on 6/2. Assessment and Plan: * Non-STEMI (non-ST elevated myocardial infarction) (HCC) Acute systolic and diastolic CHF. Appears to have some shortness of breath.  Patient's with an unwitnessed fall. Troponin went from [phone]. Cardiology consulted. EKG unremarkable for any acute ischemia. Concern for NSTEMI.  Echocardiogram shows a significant drop in EF from normal to 30 to 35% with global hypokinesis and moderate MR. Currently on IV heparin and aspirin. Continue Coreg as well as Lipitor. Underwent right and left heart catheterization on 6/2.  No culprit lesion identified, chronic appearing LCx and RCA stenosis with collaterals.  Stenosis in LAD as well. Medical management recommended.  Continue Lasix.  Rhabdomyolysis Unknown downtime. Patient tells me that he actually lowered himself to the ground rather than having a fall. CK is not significantly elevated. Agree with discontinuing the fluids.   Occasional tremors Per family patient does have some tremors at his baseline.  Etiology of this tremors have not been identified.  Recommend outpatient work-up.  Acute metabolic encephalopathy Somewhat confused and poor historian. Unsure what is the baseline. Monitor for now.  Unwitnessed fall Suspect multifactorial fall.  PT OT will be consulted  Depression Continue Zoloft. On trazodone nightly.  Essential hypertension Patient is on multiple antihypertensive medication at  home. Currently continuing Imdur, Coreg, Lasix. Holding losartan and Norvasc. Given low EF may benefit from transitioning to ARN I, will defer to cardiology.  Moderate hiatal hernia Incidentally seen on the CT scan.  Monitor.  GERD without esophagitis - Continue PPI therapy.  Hyperlipidemia Continue statin       Principal Problem:   Non-STEMI (non-ST elevated myocardial infarction) ([place]) Active Problems:   Acute combined systolic and diastolic congestive heart failure (HCC)   Rhabdomyolysis   Depression   Unwitnessed fall   Acute metabolic encephalopathy   Occasional tremors   Essential hypertension   Hyperlipidemia   GERD without esophagitis   Moderate hiatal hernia  Subjective: No chest pain continues to have shortness of breath although improving from yesterday.  Mentation improving from yesterday.  No nausea no vomiting.  Continues to have some cough.  Physical Exam: Vitals:   07/23/21 1445 07/23/21 1500 07/23/21 1515 07/23/21 1530  BP: 119/65 (!) 175/161 118/73 (!) 116/97  Pulse: 81 82 76   Resp: 19 (!) 26 (!) 23 16  Temp:      TempSrc:      SpO2:      Weight:      Height:       General: Appear in moderate distress; no visible Abnormal Neck Mass Or lumps, Conjunctiva normal Cardiovascular: S1 and S2 Present, no Murmur, Respiratory: increased respiratory effort, Bilateral Air entry present and  bilateral  Crackles, no wheezes Abdomen: Bowel Sound present, Non tender  Extremities: trace Pedal edema Neurology: alert and oriented to time, place, and person  Gait not checked due to patient safety concerns   Data Reviewed: I have Reviewed nursing notes, Vitals, and Lab results since pt's last encounter. Pertinent lab results CBC and BMP I have ordered  test including CBC and BMP I have discussed pt's care plan and test results with cardiology.   Family Communication: Son at bedside  Disposition: Status is: Inpatient Remains inpatient appropriate because:  Needing aggressive diuresis  Author: Berle Mull, MD 07/23/2021 3:48 PM  Please look on [email or web address] to find out who is on call.

## 2021-07-23 NOTE — Progress Notes (Signed)
Pts c/o feeling like hes constipated -  last BM 6/1 -pt requesting stool softener/ MD made aware

## 2021-07-24 DIAGNOSIS — J9611 Chronic respiratory failure with hypoxia: Secondary | ICD-10-CM | POA: Diagnosis present

## 2021-07-24 DIAGNOSIS — I214 Non-ST elevation (NSTEMI) myocardial infarction: Secondary | ICD-10-CM | POA: Diagnosis not present

## 2021-07-24 LAB — CBC
HCT: 33.7 % — ABNORMAL LOW (ref 39.0–52.0)
Hemoglobin: 10.6 g/dL — ABNORMAL LOW (ref 13.0–17.0)
MCH: 27.1 pg (ref 26.0–34.0)
MCHC: 31.5 g/dL (ref 30.0–36.0)
MCV: 86.2 fL (ref 80.0–100.0)
Platelets: 125 10*3/uL — ABNORMAL LOW (ref 150–400)
RBC: 3.91 MIL/uL — ABNORMAL LOW (ref 4.22–5.81)
RDW: 13.4 % (ref 11.5–15.5)
WBC: 7.7 10*3/uL (ref 4.0–10.5)
nRBC: 0 % (ref 0.0–0.2)

## 2021-07-24 LAB — BASIC METABOLIC PANEL
Anion gap: 6 (ref 5–15)
BUN: 33 mg/dL — ABNORMAL HIGH (ref 8–23)
CO2: 28 mmol/L (ref 22–32)
Calcium: 8.7 mg/dL — ABNORMAL LOW (ref 8.9–10.3)
Chloride: 105 mmol/L (ref 98–111)
Creatinine, Ser: 1.09 mg/dL (ref 0.61–1.24)
GFR, Estimated: >60 mL/min (ref >60)
Glucose, Bld: 119 mg/dL — ABNORMAL HIGH (ref 70–99)
Potassium: 3.8 mmol/L (ref 3.5–5.1)
Sodium: 139 mmol/L (ref 135–145)

## 2021-07-24 LAB — CK: Total CK: 527 U/L — ABNORMAL HIGH (ref 49–397)

## 2021-07-24 MED ORDER — FUROSEMIDE 10 MG/ML IJ SOLN: 40.0000 mg | Freq: Two times a day (BID) | INTRAMUSCULAR | Status: DC

## 2021-07-24 MED ORDER — EMPAGLIFLOZIN 10 MG PO TABS
10.0000 mg | ORAL_TABLET | Freq: Every day | ORAL | Status: DC
Start: 2021-07-24 — End: 2021-07-28
  Administered 2021-07-25 – 2021-07-28 (×4): 10 mg via ORAL
  Filled 2021-07-24 (×5): qty 1

## 2021-07-24 MED ORDER — POLYETHYLENE GLYCOL 3350 17 G PO PACK
17.0000 g | PACK | Freq: Every day | ORAL | Status: DC
  Administered 2021-07-24 – 2021-07-25 (×2): 17 g via ORAL
  Filled 2021-07-24 (×3): qty 1

## 2021-07-24 MED ORDER — FUROSEMIDE 10 MG/ML IJ SOLN
40.0000 mg | Freq: Two times a day (BID) | INTRAMUSCULAR | Status: DC
  Administered 2021-07-24: 40 mg via INTRAVENOUS
  Filled 2021-07-24: qty 4

## 2021-07-24 MED ORDER — BISACODYL 10 MG RE SUPP: 10.0000 mg | Freq: Every day | RECTAL | Status: DC | PRN

## 2021-07-24 NOTE — Progress Notes (Signed)
Progress Note Patient: Anthony Johns LNL:[id number] DOB: 1927/09/24 DOA: 07/21/2021  DOS: the patient was seen and examined on 07/24/2021  Brief hospital course: Possible history of CAD, HTN, HFpEF, HLD presented to the hospital with an unwitnessed fall where the patient describes that he lowered himself to the ground but unknown downtime.  Found to have elevated CK as well as troponin which trended up significantly.  Cardiology consulted.  Found to have acute systolic CHF.   Underwent cardiac catheterization on 6/2. Assessment and Plan: * Non-STEMI (non-ST elevated myocardial infarction) (HCC) Acute systolic and diastolic CHF. Appears to have some shortness of breath.  Patient's with an unwitnessed fall. Troponin went from [phone]. Cardiology consulted. EKG unremarkable for any acute ischemia. Concern for NSTEMI.  Echocardiogram shows a significant drop in EF from normal to 30 to 35% with global hypokinesis and moderate MR. Currently on IV heparin and aspirin. Continue Coreg as well as Lipitor. Underwent right and left heart catheterization on 6/2.  No culprit lesion identified, chronic appearing LCx and RCA stenosis with collaterals.  Stenosis in LAD as well. Medical management recommended.  Continue Lasix.  Switch back to IV Lasix due to volume overload.  Rhabdomyolysis Unknown downtime. Patient tells me that he actually lowered himself to the ground rather than having a fall. CK is not significantly elevated.  Currently actually receiving diuresis therefore no IV fluids.   Occasional tremors Per family patient does have some tremors at his baseline.  Etiology of this tremors have not been identified.  Recommend outpatient work-up.  Acute metabolic encephalopathy On admission somewhat confused and poor historian. Currently mentation significantly improved.  Appears to be close to baseline.  Unwitnessed fall Suspect multifactorial fall.  PT recommends home with home health,  patient able to ambulate 150 feet.  Depression Continue Zoloft. On trazodone nightly.  Essential hypertension Patient is on multiple antihypertensive medication at home. Currently continuing Imdur, Coreg, Lasix. Holding losartan and Norvasc. Given low EF may benefit from transitioning to ARN I, will defer to cardiology.  Chronic respiratory failure with hypoxia (HCC) On 2 LPM oxygen at home chronically. Continue to monitor for now.  Moderate hiatal hernia Incidentally seen on the CT scan.  Monitor.  GERD without esophagitis Continue PPI therapy.  Hyperlipidemia Continue statin  Subjective: No nausea no vomiting no fever no chills.  Continues to have shortness of breath continues to have some cough.  Still reports constipation.  Physical Exam: Vitals:   07/24/21 0759 07/24/21 1156 07/24/21 1602 07/24/21 1623  BP: 111/64 107/71 110/65   Pulse: 69 74 86   Resp: '17 18 18   '$ Temp: 97.7 F (36.5 C) 97.9 F (36.6 C) 97.9 F (36.6 C)   TempSrc: Oral     SpO2: 98% 95% 97% 91%  Weight:      Height:       General: Appear in mild distress; no visible Abnormal Neck Mass Or lumps, Conjunctiva normal Cardiovascular: S1 and S2 Present, no Murmur, Respiratory: increased respiratory effort, Bilateral Air entry present and  bilateral Crackles, no wheezes Abdomen: Bowel Sound present, Non tender  Extremities: no Pedal edema Neurology: alert and oriented to time and place  Gait not checked due to patient safety concerns   Data Reviewed: I have Reviewed nursing notes, Vitals, and Lab results since pt's last encounter. Pertinent lab results CBC and BMP I have ordered test including CBC BMP and magnesium I have discussed pt's care plan and test results with cardiology.   Family Communication: Family at  bedside  Disposition: Status is: Inpatient Remains inpatient appropriate because: Receiving aggressive IV diuresis.  Author: Berle Mull, MD 07/24/2021 4:46 PM  Please look on  [email or web address] to find out who is on call.

## 2021-07-24 NOTE — Progress Notes (Signed)
Cardiology Progress Note   Patient Name: Anthony Johns Date of Encounter: 07/24/2021  Primary Cardiologist: Kathlyn Sacramento, MD  Subjective   No chest pain.  Cont to have dyspnea w/ minimal activity/repositioning/speech.  Inpatient Medications    Scheduled Meds:  aspirin  81 mg Oral Daily   atorvastatin  80 mg Oral Daily   carvedilol  6.25 mg Oral BID   clopidogrel  75 mg Oral Q breakfast   furosemide  40 mg Oral BID   mouth rinse  15 mL Mouth Rinse BID   pantoprazole  40 mg Oral Q supper   senna-docusate  1 tablet Oral BID   sertraline  50 mg Oral Daily   sodium chloride flush  3 mL Intravenous Q12H   sodium chloride flush  3 mL Intravenous Q12H   cyanocobalamin  1,000 mcg Oral Daily   Continuous Infusions:  sodium chloride     PRN Meds: sodium chloride, acetaminophen **OR** acetaminophen, ipratropium-albuterol, nitroGLYCERIN, ondansetron **OR** ondansetron (ZOFRAN) IV, sodium chloride flush, traZODone   Vital Signs    Vitals:   07/24/21 0045 07/24/21 0429 07/24/21 0442 07/24/21 0759  BP: 119/66 109/65  111/64  Pulse: 72 71  69  Resp: '20 16  17  '$ Temp: 98.1 F (36.7 C) 97.9 F (36.6 C)  97.7 F (36.5 C)  TempSrc: Oral Oral  Oral  SpO2: 97% 98% 98% 98%  Weight:      Height:        Intake/Output Summary (Last 24 hours) at 07/24/2021 1156 Last data filed at 07/23/2021 1841 Gross per 24 hour  Intake 240 ml  Output 450 ml  Net -210 ml   Filed Weights   07/21/21 2119 07/22/21 1547  Weight: 78.8 kg 76.6 kg    Physical Exam   GEN: Well nourished, well developed, in no acute distress.  HEENT: Grossly normal.  Neck: Supple, mod elev JVP, no carotid bruits, or masses. Cardiac: RRR, no murmurs, rubs, or gallops. No clubbing, cyanosis, edema.  Radials 2+, DP/PT 2+ and equal bilaterally.  R femoral cath site w/o bleeding/bruit/hematoma. Respiratory:  Respirations regular and unlabored, bibasilar crackles, otw diminished bilat. GI: Soft, nontender, nondistended,  BS + x 4. MS: no deformity or atrophy. Skin: warm and dry, no rash. Neuro:  Strength and sensation are intact. Psych: AAOx3.  Normal affect.  Labs    Chemistry Recent Labs  Lab 07/21/21 1739 07/22/21 0712 07/23/21 0658  NA 137 140 138  K 4.2 4.2 3.4*  CL 102 109 107  CO2 23 21* 23  GLUCOSE 115* 109* 101*  BUN 19 24* 36*  CREATININE 0.99 1.06 1.10  CALCIUM 9.6 9.0 8.9  PROT 8.0  --   --   ALBUMIN 3.7  --   --   AST 101*  --   --   ALT 33  --   --   ALKPHOS 80  --   --   BILITOT 2.5*  --   --   GFRNONAA >60 >60 >60  ANIONGAP '12 10 8     '$ Hematology Recent Labs  Lab 07/22/21 0712 07/23/21 0658 07/24/21 0411  WBC 11.7* 7.7 7.7  RBC 4.35 3.87* 3.91*  HGB 11.9* 10.4* 10.6*  HCT 38.0* 32.9* 33.7*  MCV 87.4 85.0 86.2  MCH 27.4 26.9 27.1  MCHC 31.3 31.6 31.5  RDW 13.6 13.4 13.4  PLT 148* 125* 125*    Cardiac Enzymes  Recent Labs  Lab 07/21/21 1739 07/22/21 0712 07/22/21 1711  TROPONINIHS 525* 5,944*  10,784*      BNP    Component Value Date/Time   BNP 1,373.1 (H) 07/21/2021 1739    Radiology    CT HEAD WO CONTRAST (5MM)  Result Date: 07/21/2021 CLINICAL DATA:  Altered mental status, patient found down on floor. EXAM: CT HEAD WITHOUT CONTRAST TECHNIQUE: Contiguous axial images were obtained from the base of the skull through the vertex without intravenous contrast. RADIATION DOSE REDUCTION: This exam was performed according to the departmental dose-optimization program which includes automated exposure control, adjustment of the mA and/or kV according to patient size and/or use of iterative reconstruction technique. COMPARISON:  None Available. FINDINGS: Brain: Remote lacunar infarcts of the left caudate head and right caudate body. Periventricular white matter and corona radiata hypodensities favor chronic ischemic microvascular white matter disease. Otherwise, the brainstem, cerebellum, cerebral peduncles, thalamus, basal ganglia, basilar cisterns, and  ventricular system appear within normal limits. No intracranial hemorrhage, mass lesion, or acute CVA. Vascular: There is atherosclerotic calcification of the cavernous carotid arteries bilaterally. Skull: Unremarkable Sinuses/Orbits: Mild chronic ethmoid and right frontal sinusitis. Other: No supplemental non-categorized findings. IMPRESSION: 1. No acute intracranial findings. 2. Remote lacunar infarcts of the caudate nuclei. 3. Periventricular white matter and corona radiata hypodensities favor chronic ischemic microvascular white matter disease. 4. Atherosclerosis. 5. Mild chronic ethmoid and right frontal sinusitis. Electronically Signed   By: Van Clines M.D.   On: 07/21/2021 18:50   CT ABDOMEN PELVIS W CONTRAST  Result Date: 07/21/2021 CLINICAL DATA:  Trauma, fall EXAM: CT ABDOMEN AND PELVIS WITH CONTRAST TECHNIQUE: Multidetector CT imaging of the abdomen and pelvis was performed using the standard protocol following bolus administration of intravenous contrast. RADIATION DOSE REDUCTION: This exam was performed according to the departmental dose-optimization program which includes automated exposure control, adjustment of the mA and/or kV according to patient size and/or use of iterative reconstruction technique. CONTRAST:  163m OMNIPAQUE IOHEXOL 300 MG/ML  SOLN COMPARISON:  None Available. FINDINGS: Lower chest: There are few small nodular densities in both lower lung fields largest measuring 10 mm in size in the left lower lobe. These nodules have been seen in the previous examinations dating as far back as 05/25/2017. Nodule in the left lower lobe measures minimally larger. Coronary artery calcifications are seen. Hepatobiliary: There is fatty infiltration in the liver. Gallbladder is distended. There is possible minimal amount of fluid adjacent to the inferior margin of the liver. There is slight motion artifact adjacent to the liver and gallbladder limiting evaluation. In the delayed images,  there is no definite wall thickening in gallbladder and there is no definite evidence of any fluid adjacent to the liver. Pancreas: There is pancreatic atrophy. No focal abnormality is seen. Spleen: Spleen is enlarged measuring 14.2 cm in maximum diameter. Adrenals/Urinary Tract: Nodularity seen in both adrenals has not changed significantly since 11/14/2019. There is no hydronephrosis. There are no renal or ureteral stones. Urinary bladder is unremarkable. Stomach/Bowel: There is moderate sized hiatal hernia. Stomach is not distended. Small bowel loops are not dilated. Appendix is not distinctly seen. There is no pericecal inflammation. There is no significant wall thickening in colon. There is no pericolic stranding. Scattered diverticula are seen in colon without signs of focal diverticulitis. Vascular/Lymphatic: Arterial calcifications are seen. Reproductive: Prostate is enlarged with inhomogeneous attenuation. Other: There is no ascites or pneumoperitoneum. Left inguinal hernia containing fat is seen. Musculoskeletal: Degenerative changes are noted in the lumbar spine with disc space narrowing, bony spurs and facet hypertrophy. IMPRESSION: There is no evidence  of intestinal obstruction or pneumoperitoneum. There is no hydronephrosis. There are few nodules in both lower lung fields. Nodule in the left lower lobe appears minimally more prominent. However, the nodules have been noted in the previous studies dating as far back as 05/25/2017, most likely suggesting benign etiology. Fatty liver. Moderate sized hiatal hernia. Enlarged spleen. Diverticulosis of colon without signs of diverticulitis. Enlarged prostate. Gallbladder is distended without definite wall thickening. There is no dilation of bile ducts. In the some of the images motion artifacts limit evaluation of liver and gallbladder. If there are any focal symptoms in the right upper quadrant, follow-up sonogram may be considered. Other findings as  described in the body of the report. Electronically Signed   By: Elmer Picker M.D.   On: 07/21/2021 21:04   DG Chest Port 1 View  Result Date: 07/22/2021 CLINICAL DATA:  Shortness of breath EXAM: PORTABLE CHEST 1 VIEW COMPARISON:  Chest x-ray dated Jul 21, 2021 FINDINGS: Cardiac and mediastinal contours are unchanged. New bilateral heterogeneous opacities with more focal consolidation of the right lower lobe. No large pleural effusion or pneumothorax. IMPRESSION: New bilateral heterogeneous opacities with more focal consolidation of the right lower lobe, differential considerations include pulmonary edema or infection. Electronically Signed   By: Yetta Glassman M.D.   On: 07/22/2021 09:45   DG Chest Port 1 View  Result Date: 07/21/2021 CLINICAL DATA:  [id number]. AMS; Questionable sepsis; Pt to ED via [facility] from home for fall. Pt was found on the floor this this afternoon by family, unknown when he fell. Non of his morning medications were taken and walker was tipped over in the floor. EXAM: PORTABLE CHEST 1 VIEW COMPARISON:  CT chest 11/14/2019, chest x-ray 07/30/2017 FINDINGS: The heart and mediastinal contours are unchanged. Aortic calcification. No definite focal consolidation. Biapical pleural/pulmonary scarring. Diffuse chronic interstitial markings with no overt pulmonary edema. No pleural effusion. No pneumothorax. No acute osseous abnormality. IMPRESSION: 1. No active disease. 2. Aortic Atherosclerosis (ICD10-I70.0) and Emphysema (ICD10-J43.9). Electronically Signed   By: Iven Finn M.D.   On: 07/21/2021 18:19   Telemetry    RSR, 70's - Personally Reviewed  Cardiac Studies   2D Echocardiogram 6.1.2023   1. Left ventricular ejection fraction, by estimation, is 30 to 35%. The  left ventricle has moderately decreased function. The left ventricle  demonstrates regional wall motion abnormalities (see scoring  diagram/findings for description). There is mild  left ventricular  hypertrophy. Left ventricular diastolic parameters are  consistent with Grade II diastolic dysfunction (pseudonormalization).   2. Right ventricular systolic function is mildly reduced. The right  ventricular size is normal. Tricuspid regurgitation signal is inadequate  for assessing PA pressure.   3. The mitral valve is abnormal. Mild to moderate mitral valve  regurgitation. No evidence of mitral stenosis. Moderate mitral annular  calcification.   4. The aortic valve was not well visualized. There is mild calcification  of the aortic valve. There is mild thickening of the aortic valve. Aortic  valve regurgitation is trivial.   5. The inferior vena cava is normal in size with <50% respiratory  variability, suggesting right atrial pressure of 8 mmHg.  _____________  Cardiac Catheterization  6.2.2023  Left Main  There is mild diffuse disease throughout the vessel.  Left Anterior Descending  Ost LAD to Prox LAD lesion is 40% stenosed. The lesion is eccentric. The lesion is severely calcified.  Mid LAD lesion is 50% stenosed. The lesion is severely calcified.  First Engineer, production  The vessel exhibits minimal luminal irregularities.  Second Diagonal Branch  The vessel exhibits minimal luminal irregularities.  Left Circumflex  Ost Cx to Prox Cx lesion is 50% stenosed.  Mid Cx lesion is 100% stenosed. The lesion is severely calcified.  First Obtuse Marginal Branch  Vessel is small in size.  Second Obtuse Marginal Branch  Collaterals  2nd Mrg filled by collaterals from 1st Mrg.    Right Coronary Artery  Ost RCA to Prox RCA lesion is 100% stenosed. The lesion is severely calcified.  Right Posterior Descending Artery  Collaterals  RPDA filled by collaterals from Dist LAD.    Right Posterior Atrioventricular Artery  Collaterals  RPAV filled by collaterals from [place].  Right heart catheterization showed moderately elevated filling pressures, moderate pulmonary hypertension and normal  cardiac output. _____________   Patient Profile     88 y.o. male with a history of CAD s/p multiple PCI's w/ known CTO of the LCX, COPD, bilateral carotid dzs, HFpEF, chronic venous insuff, HTN, HL, and hiatal hernia, who presented 5/31 w/ AMS and probable mild rhabdomyolysis after unknown duration of being on his floor at home, w/ subsequent finding of NSTEMI and HsTrop up to 10784.  EF 30-35% by echo (new).  Cath 6/2 w/ CTOs of RCA and LCX w/ collateral circulation.  Assessment & Plan    1.  NSTEMI/CAD:  Presented 5/31 w/ AMS and mild rhabdo.  HsTrop 525  5944  [id number].  Cath 6/2 w/ CTOs of RCA (previously stented in 2010) and LCX w/ L  R and L  L collaterals.  Mild elevation of filling pressures.  No chest pain before or since admission.  Remains dyspneic.  Will transition back to IV lasix for today.  Cont med rx including DAPT, ? blocker.  2.  Acute systolic CHF/ICM:  Echo this admission w/ EF 30-35%, mild LVH, GrII DD, mild-mod MR.  Minus 610 yesterday and minus 420 ml for admission. Still w/ dyspnea w/ minimal activity/speech, bibasilar crackles, and JVD.  Mod elev Right heart filling pressures and mod PAH on RHC yesterday.  Currently on PO lasix.  As above, switching back to IV for today.  BMET pending. Cont ? blocker.  BP soft and currently no room to add arb/arni/mra.  Consider sglt2i.  3.  Essential HTN:  stable on ? blocker.  4.  HL:  Cont high potency statin rx.  5.  Rhabdomyolysis:  Prolonged time on floor, though duration unknown.  CK 3433 on arrival but down trending since.  6.  Hypokalemia:  supplemented yesterday.  Add on bmet to blood in lab this AM.  Signed, Murray Hodgkins, NP  07/24/2021, 11:56 AM    For questions or updates, please contact   Please consult [email or web address] for contact info under Cardiology/STEMI.

## 2021-07-24 NOTE — Evaluation (Signed)
Physical Therapy Evaluation Patient Details Name: Anthony Johns MRN: [id number] DOB: 03-03-1927 Today's Date: 07/24/2021  History of Present Illness  Anthony Johns is a 88 y.o. male with medical history significant for HFpEF, CAD, HTN and dyslipidemia, who presented to the emergency room with acute onset of suspected fall per his son that likely happened this morning.  The patient apparently read remained on the ground in the hallway.  His neighbor tried to call him with no answer.  She is then called his daughter will let this son know.  When he came he found him on the floor with urine and feces on him.    Clinical Impression  Pt received in Semi-Fowler's position and agreeable to therapy.  Pt unable to identify current year or president, but is A&O to person, place, and situation.  Pt agreeable with encouragement to get up and ambulate, but did so reluctantly.  Pt put forth good effort throughout, however notes that he gets SOB when ambulating longer distances.  Pt able to ambulate approximately 100' before needing to take a seated rest break in chair.  Pt then educated again on pursed lip breathing and was able to regain his breath.  Pt then ambulate back to room and able to transfer back to bed and situated himself comfortably.  Will need to reassess home set-up due to pt being unsure at first, then son/DIL reporting that they were currently making arrangements with other siblings and would speak about it tomorrow, Sunday 07/25/21.  Pt left in room with all needs met and call bell within reach.  Current discharge plans to home with HHPT are appropriate at this time.  Pt will continue to benefit from skilled therapy in order to address deficits listed below.      Recommendations for follow up therapy are one component of a multi-disciplinary discharge planning process, led by the attending physician.  Recommendations may be updated based on patient status, additional functional criteria and  insurance authorization.  Follow Up Recommendations Home health PT    Assistance Recommended at Discharge Intermittent Supervision/Assistance  Patient can return home with the following  A little help with walking and/or transfers;A little help with bathing/dressing/bathroom    Equipment Recommendations Rolling walker (2 wheels)  Recommendations for Other Services       Functional Status Assessment Patient has had a recent decline in their functional status and demonstrates the ability to make significant improvements in function in a reasonable and predictable amount of time.     Precautions / Restrictions Precautions Precautions: Fall Restrictions Weight Bearing Restrictions: No      Mobility  Bed Mobility Overal bed mobility: Needs Assistance Bed Mobility: Supine to Sit     Supine to sit: Supervision          Transfers Overall transfer level: Needs assistance Equipment used: Rolling walker (2 wheels) Transfers: Sit to/from Stand Sit to Stand: Supervision                Ambulation/Gait Ambulation/Gait assistance: Min guard Gait Distance (Feet): 160 Feet Assistive device: Rolling walker (2 wheels) Gait Pattern/deviations: Step-through pattern, Decreased stride length Gait velocity: decreased     General Gait Details: Pt requires verbal cuing for staying within walker as he does not typically use RW, instead using a walking stick.  Stairs            Wheelchair Mobility    Modified Rankin (Stroke Patients Only)       Balance Overall balance assessment:  Needs assistance Sitting-balance support: No upper extremity supported, Feet supported Sitting balance-Leahy Scale: Good     Standing balance support: No upper extremity supported, During functional activity Standing balance-Leahy Scale: Fair                               Pertinent Vitals/Pain Pain Assessment Pain Assessment: No/denies pain    Home Living Family/patient  expects to be discharged to:: Private residence Living Arrangements: Alone Available Help at Discharge: Family;Available 24 hours/day Type of Home: House                  Prior Function Prior Level of Function : Independent/Modified Independent (DIL reprots pt even drives when needed.)                     Hand Dominance   Dominant Hand: Right    Extremity/Trunk Assessment   Upper Extremity Assessment Upper Extremity Assessment: Generalized weakness    Lower Extremity Assessment Lower Extremity Assessment: Generalized weakness       Communication   Communication: No difficulties  Cognition Arousal/Alertness: Awake/alert Behavior During Therapy: WFL for tasks assessed/performed Overall Cognitive Status: Within Functional Limits for tasks assessed                                          General Comments      Exercises     Assessment/Plan    PT Assessment Patient needs continued PT services  PT Problem List Decreased strength;Decreased activity tolerance;Decreased balance;Decreased mobility       PT Treatment Interventions DME instruction;Gait training;Functional mobility training;Therapeutic activities;Therapeutic exercise;Balance training;Neuromuscular re-education    PT Goals (Current goals can be found in the Care Plan section)  Acute Rehab PT Goals Patient Stated Goal: to go back home. PT Goal Formulation: With patient Time For Goal Achievement: 08/07/21 Potential to Achieve Goals: Good    Frequency Min 2X/week     Co-evaluation               AM-PAC PT "6 Clicks" Mobility  Outcome Measure Help needed turning from your back to your side while in a flat bed without using bedrails?: A Little Help needed moving from lying on your back to sitting on the side of a flat bed without using bedrails?: A Little Help needed moving to and from a bed to a chair (including a wheelchair)?: A Little Help needed standing up from a  chair using your arms (e.g., wheelchair or bedside chair)?: A Little Help needed to walk in hospital room?: A Little Help needed climbing 3-5 steps with a railing? : A Lot 6 Click Score: 17    End of Session Equipment Utilized During Treatment: Gait belt Activity Tolerance: Patient tolerated treatment well Patient left: in bed;with call bell/phone within reach;with bed alarm set;with family/visitor present Nurse Communication: Mobility status PT Visit Diagnosis: Other abnormalities of gait and mobility (R26.89);Muscle weakness (generalized) (M62.81);Difficulty in walking, not elsewhere classified (R26.2)    Time: 1451-1531 PT Time Calculation (min) (ACUTE ONLY): 40 min   Charges:   PT Evaluation $PT Eval Low Complexity: 1 Low PT Treatments $Gait Training: 23-37 mins        Gwenlyn Saran, PT, DPT 07/24/21, 4:33 PM   Christie Nottingham 07/24/2021, 4:28 PM

## 2021-07-24 NOTE — Assessment & Plan Note (Addendum)
COPD. On 2 LPM oxygen at home chronically. Due to ongoing shortness of breath, will add Dulera and Incruse and monitor. Home O2 evaluation tomorrow.

## 2021-07-25 DIAGNOSIS — I214 Non-ST elevation (NSTEMI) myocardial infarction: Secondary | ICD-10-CM | POA: Diagnosis not present

## 2021-07-25 LAB — BASIC METABOLIC PANEL
Anion gap: 7 (ref 5–15)
BUN: 32 mg/dL — ABNORMAL HIGH (ref 8–23)
CO2: 26 mmol/L (ref 22–32)
Calcium: 9 mg/dL (ref 8.9–10.3)
Chloride: 103 mmol/L (ref 98–111)
Creatinine, Ser: 1.12 mg/dL (ref 0.61–1.24)
GFR, Estimated: >60 mL/min (ref >60)
Glucose, Bld: 113 mg/dL — ABNORMAL HIGH (ref 70–99)
Potassium: 3.4 mmol/L — ABNORMAL LOW (ref 3.5–5.1)
Sodium: 136 mmol/L (ref 135–145)

## 2021-07-25 LAB — CK: Total CK: 215 U/L (ref 49–397)

## 2021-07-25 MED ORDER — FUROSEMIDE 20 MG PO TABS
20.0000 mg | ORAL_TABLET | Freq: Every day | ORAL | Status: DC
  Administered 2021-07-25 – 2021-07-26 (×2): 20 mg via ORAL
  Filled 2021-07-25 (×2): qty 1

## 2021-07-25 MED ORDER — UMECLIDINIUM BROMIDE 62.5 MCG/ACT IN AEPB
1.0000 | INHALATION_SPRAY | Freq: Every day | RESPIRATORY_TRACT | Status: DC
Start: 2021-07-25 — End: 2021-07-28
  Administered 2021-07-25 – 2021-07-28 (×4): 1 via RESPIRATORY_TRACT
  Filled 2021-07-25: qty 7

## 2021-07-25 MED ORDER — SPIRONOLACTONE 25 MG PO TABS
12.5000 mg | ORAL_TABLET | Freq: Every day | ORAL | Status: DC
  Administered 2021-07-25 – 2021-07-26 (×2): 12.5 mg via ORAL
  Filled 2021-07-25: qty 1
  Filled 2021-07-25 (×2): qty 0.5
  Filled 2021-07-25: qty 1

## 2021-07-25 MED ORDER — POTASSIUM CHLORIDE CRYS ER 20 MEQ PO TBCR
40.0000 meq | EXTENDED_RELEASE_TABLET | Freq: Once | ORAL | Status: AC
Start: 2021-07-25 — End: 2021-07-25
  Administered 2021-07-25: 40 meq via ORAL
  Filled 2021-07-25: qty 2

## 2021-07-25 MED ORDER — ENOXAPARIN SODIUM 40 MG/0.4ML IJ SOSY
40.0000 mg | PREFILLED_SYRINGE | INTRAMUSCULAR | Status: DC
  Administered 2021-07-25 – 2021-07-27 (×3): 40 mg via SUBCUTANEOUS
  Filled 2021-07-25 (×4): qty 0.4

## 2021-07-25 MED ORDER — MOMETASONE FURO-FORMOTEROL FUM 100-5 MCG/ACT IN AERO
2.0000 | INHALATION_SPRAY | Freq: Two times a day (BID) | RESPIRATORY_TRACT | Status: DC
  Administered 2021-07-25 – 2021-07-28 (×7): 2 via RESPIRATORY_TRACT
  Filled 2021-07-25: qty 8.8

## 2021-07-25 NOTE — TOC Initial Note (Addendum)
Transition of Care [facility]) - Initial/Assessment Note    Patient Details  Name: Anthony Johns MRN: [id number] Date of Birth: 08/03/1927  Transition of Care [facility]) CM/SW Contact:    Harriet Masson, RN Phone Number:[phone] 07/25/2021, 10:25 AM  Clinical Narrative:                 Recommendation for HHPT and DME for 2 wheel RW.RN spoke with pt who was receptive to [employer] who will delivery to bedside. Pt receptive to any agency for HHPT. RN contacted Tommi Rumps with [facility] who accepted pt for PT services.   Pt lives alone and continue to drive to all his appointments, pt able to afford all his medications and  has mail delivery. Pt has 4 children and several grandchildren that live nearby for his support system. Pt on home O2 currently on 1-2 liters when needed. No other needs as pt reports he does not have any DME in the home to assist him at this time with no other needs.  TOC will continue to monitor and follow up accordingly with any additional discharge needs.  Expected Discharge Plan: [place] Barriers to Discharge: Continued Medical Work up   Patient Goals and CMS Choice     Choice offered to / list presented to : Patient  Expected Discharge Plan and Services Expected Discharge Plan: [place]   Discharge Planning Services: CM Consult                     DME Arranged: Gilford Rile DME Agency: [facility] Date DME Agency Contacted: 07/25/21 Time DME Agency Contacted: 78 Representative spoke with at DME Agency: [place]: PT [place]: [place] Date [place]: 07/25/21 Time [place]: 1024 Representative spoke with at [place]: [place] Arrangements/Services   Lives with:: Self Patient language and need for interpreter reviewed:: No Do you feel safe going back to the place where you live?: Yes      Need for Family Participation in Patient Care: Yes (Comment) Care giver  support system in place?: Yes (comment)   Criminal Activity/Legal Involvement Pertinent to Current Situation/Hospitalization: No - Comment as needed  Activities of Daily Living Home Assistive Devices/Equipment: Cane (specify quad or straight), Oxygen, Walker (specify type) ADL Screening (condition at time of admission) Patient's cognitive ability adequate to safely complete daily activities?: Yes Is the patient deaf or have difficulty hearing?: No Does the patient have difficulty seeing, even when wearing glasses/contacts?: No Does the patient have difficulty concentrating, remembering, or making decisions?: Yes Patient able to express need for assistance with ADLs?: Yes Does the patient have difficulty dressing or bathing?: No Independently performs ADLs?: Yes (appropriate for developmental age) Does the patient have difficulty walking or climbing stairs?: Yes Weakness of Legs: Both Weakness of Arms/Hands: None  Permission Sought/Granted   Permission granted to share information with : Yes, Verbal Permission Granted              Emotional Assessment Appearance:: Appears stated age Attitude/Demeanor/Rapport: Engaged Affect (typically observed): Accepting Orientation: : Oriented to Self, Oriented to Place, Oriented to  Time, Oriented to Situation Alcohol / Substance Use: Not Applicable Psych Involvement: No (comment)  Admission diagnosis:  Rhabdomyolysis [M62.82] Fall, initial encounter [W19.XXXA] Traumatic rhabdomyolysis, initial encounter ([place]) [T79.6XXA] Patient Active Problem List   Diagnosis Date Noted   Chronic respiratory failure with hypoxia ([place]) 07/24/2021   Non-STEMI (non-ST elevated myocardial infarction) (  [place]) 07/22/2021   Depression 07/22/2021   GERD without esophagitis 07/22/2021   Unwitnessed fall 07/22/2021   Acute combined systolic and diastolic congestive heart failure ([place]) 09/73/5329   Acute metabolic encephalopathy 92/42/6834   Occasional tremors  07/22/2021   Moderate hiatal hernia 07/22/2021   Rhabdomyolysis 07/21/2021   CAP (community acquired pneumonia) 07/25/2017   Coronary artery disease involving native coronary artery with angina pectoris ([place]) 11/13/2014   Right carotid bruit 11/13/2014   Hyperlipidemia    Essential hypertension    PCP:  Baxter Hire, MD Pharmacy:   [facility] [place], [place] - [address] [place] [address] Phone: [phone] Fax: [facility] #[id number] [address] [place] [place] [address] Phone: [phone] Fax: [phone]  [facility] [place], [place] [address] [place] [address] Phone: [phone] Fax: [phone]  [facility] [place] [place] [facility] [address] [employer] [address] [place] [address] Phone: [phone] Fax: [phone]     Social Determinants of Health (SDOH) Interventions    Readmission Risk Interventions     View : No data to display.

## 2021-07-25 NOTE — Progress Notes (Signed)
Progress Note Patient: Anthony Johns:[id number] DOB: 10-Feb-1928 DOA: 07/21/2021  DOS: the patient was seen and examined on 07/25/2021  Brief hospital course: Possible history of CAD, HTN, HFpEF, HLD presented to the hospital with an unwitnessed fall where the patient describes that he lowered himself to the ground but unknown downtime.  Found to have elevated CK as well as troponin which trended up significantly.  Cardiology consulted.  Found to have acute systolic CHF.   Underwent cardiac catheterization on 6/2.  No culprit lesion.  Medically managed. Assessment and Plan: * Non-STEMI (non-ST elevated myocardial infarction) (HCC) Acute combined systolic and diastolic CHF. Presents with an unwitnessed fall.  Appears to have severe shortness of breath Troponin peaked at 18,000. Cardiology consulted. EKG unremarkable for any acute ischemia. Echocardiogram shows a significant drop in EF from normal to 30 to 35% with global hypokinesis and moderate MR. Was on IV heparin. Underwent right and left heart catheterization on 6/2.  No culprit lesion identified, chronic appearing LCx and RCA stenosis with collaterals.  Stenosis in LAD as well. Medical management recommended. Currently on Aldactone, Lasix, Coreg 6.25 mg twice daily, Lipitor 80 mg daily, aspirin 81 mg daily. ACE inhibitor/ARB/ARNI outpatient due to relative hypotension. Continue Lasix.  Rhabdomyolysis Unknown downtime. Patient tells me that he actually lowered himself to the ground rather than having a fall. CK is not significantly elevated.   Occasional tremors Per family patient does have some tremors at his baseline.  Recommend outpatient work-up.  Acute metabolic encephalopathy On admission somewhat confused and poor historian. Currently mentation significantly improved.  Appears to be close to baseline.  Unwitnessed fall Suspect multifactorial fall.  PT recommends home with home health, patient able to ambulate 150  feet.  Depression Continue Zoloft. On trazodone nightly.  Essential hypertension Patient is on multiple antihypertensive medication at home. Currently continuing Imdur, Coreg, Lasix. Holding losartan and Norvasc.  Chronic respiratory failure with hypoxia (HCC) COPD. On 2 LPM oxygen at home chronically. Due to ongoing shortness of breath, will add Dulera and Incruse and monitor. Home O2 evaluation tomorrow.  Moderate hiatal hernia Incidentally seen on the CT scan.  Monitor.  GERD without esophagitis Continue PPI therapy.  Hyperlipidemia Continue statin  Subjective: Continues to have shortness of breath.  No cough.  No nausea no vomiting.  Reports fatigue due to shortness of breath.  No dizziness or lightheadedness.  Constipation resolved.  Physical Exam: Vitals:   07/25/21 0447 07/25/21 0851 07/25/21 1212 07/25/21 1648  BP: 119/72 124/69 123/71 118/78  Pulse: 82 74 84 84  Resp: '18 18 17 18  '$ Temp: 98 F (36.7 C) 97.7 F (36.5 C) 97.8 F (36.6 C) 97.8 F (36.6 C)  TempSrc: Oral Oral    SpO2: 98% 99% 92% 96%  Weight:      Height:       General: Appear in moderate distress; no visible Abnormal Neck Mass Or lumps, Conjunctiva normal Cardiovascular: S1 and S2 Present, no Murmur, Respiratory: increased respiratory effort, Bilateral Air entry present and bilateral Crackles, Occasional wheezes Abdomen: Bowel Sound present, Non tender Extremities: no Pedal edema Neurology: alert and oriented to time, place, and person  Gait not checked due to patient safety concerns   Data Reviewed: I have Reviewed nursing notes, Vitals, and Lab results since pt's last encounter. Pertinent lab results CBC and BMP I have ordered test including CBC and BMP I have reviewed the last note from cardiology,    Family Communication: Family at bedside  Disposition: Status is:  Inpatient Remains inpatient appropriate because: Ongoing respiratory distress requiring further work-up and  therapy.  Author: Berle Mull, MD 07/25/2021 4:57 PM  Please look on [email or web address] to find out who is on call.

## 2021-07-25 NOTE — Progress Notes (Signed)
Cardiology Progress Note  Patient ID: Anthony Johns MRN: [id number] DOB: June 09, 1927 Date of Encounter: 07/25/2021  Primary Cardiologist: Kathlyn Sacramento, MD  Subjective   Chief Complaint: None.   HPI: Shortness of breath improving.  Good diuresis.  ROS:  All other ROS reviewed and negative. Pertinent positives noted in the HPI.     Inpatient Medications  Scheduled Meds:  aspirin  81 mg Oral Daily   atorvastatin  80 mg Oral Daily   carvedilol  6.25 mg Oral BID   clopidogrel  75 mg Oral Q breakfast   empagliflozin  10 mg Oral Daily   enoxaparin (LOVENOX) injection  40 mg Subcutaneous Q24H   furosemide  20 mg Oral Daily   mouth rinse  15 mL Mouth Rinse BID   mometasone-formoterol  2 puff Inhalation BID   pantoprazole  40 mg Oral Q supper   polyethylene glycol  17 g Oral Daily   senna-docusate  1 tablet Oral BID   sertraline  50 mg Oral Daily   sodium chloride flush  3 mL Intravenous Q12H   sodium chloride flush  3 mL Intravenous Q12H   spironolactone  12.5 mg Oral Daily   umeclidinium bromide  1 puff Inhalation Daily   cyanocobalamin  1,000 mcg Oral Daily   Continuous Infusions:  sodium chloride     PRN Meds: sodium chloride, acetaminophen **OR** acetaminophen, bisacodyl, ipratropium-albuterol, nitroGLYCERIN, ondansetron **OR** ondansetron (ZOFRAN) IV, sodium chloride flush, traZODone   Vital Signs   Vitals:   07/24/21 1848 07/25/21 0446 07/25/21 0447 07/25/21 0851  BP: 117/76 119/72 119/72 124/69  Pulse: 83 75 82 74  Resp: '18  18 18  '$ Temp: 98.4 F (36.9 C)  98 F (36.7 C) 97.7 F (36.5 C)  TempSrc: Oral  Oral Oral  SpO2: 97% 97% 98% 99%  Weight:      Height:        Intake/Output Summary (Last 24 hours) at 07/25/2021 1128 Last data filed at 07/25/2021 1000 Gross per 24 hour  Intake 600 ml  Output 1500 ml  Net -900 ml      07/22/2021    3:47 PM 07/21/2021    9:19 PM 12/20/2019    9:52 AM  Last 3 Weights  Weight (lbs) 168 lb 12.8 oz 173 lb 11.6 oz 184 lb   Weight (kg) 76.567 kg 78.8 kg 83.462 kg      Telemetry  Overnight telemetry shows SR 80s, which I personally reviewed.   Physical Exam   Vitals:   07/24/21 1848 07/25/21 0446 07/25/21 0447 07/25/21 0851  BP: 117/76 119/72 119/72 124/69  Pulse: 83 75 82 74  Resp: '18  18 18  '$ Temp: 98.4 F (36.9 C)  98 F (36.7 C) 97.7 F (36.5 C)  TempSrc: Oral  Oral Oral  SpO2: 97% 97% 98% 99%  Weight:      Height:        Intake/Output Summary (Last 24 hours) at 07/25/2021 1128 Last data filed at 07/25/2021 1000 Gross per 24 hour  Intake 600 ml  Output 1500 ml  Net -900 ml       07/22/2021    3:47 PM 07/21/2021    9:19 PM 12/20/2019    9:52 AM  Last 3 Weights  Weight (lbs) 168 lb 12.8 oz 173 lb 11.6 oz 184 lb  Weight (kg) 76.567 kg 78.8 kg 83.462 kg    Body mass index is 27.25 kg/m.  General: Well nourished, well developed, in no acute distress Head: Atraumatic,  normal size  Eyes: PEERLA, EOMI  Neck: Supple, no JVD Endocrine: No thryomegaly Cardiac: Normal S1, S2; RRR; no murmurs, rubs, or gallops Lungs: Diminished breath sounds bilaterally Abd: Soft, nontender, no hepatomegaly  Ext: No edema, pulses 2+ Musculoskeletal: No deformities, BUE and BLE strength normal and equal Skin: Warm and dry, no rashes   Neuro: Alert and oriented to person, place, time, and situation, CNII-XII grossly intact, no focal deficits  Psych: Normal mood and affect   Labs  High Sensitivity Troponin:   Recent Labs  Lab 07/21/21 1739 07/22/21 0712 07/22/21 1711  TROPONINIHS 525* 5,944* 10,784*     Cardiac EnzymesNo results for input(s): TROPONINI in the last 168 hours. No results for input(s): TROPIPOC in the last 168 hours.  Chemistry Recent Labs  Lab 07/21/21 1739 07/22/21 0712 07/23/21 0658 07/24/21 0411 07/25/21 0539  NA 137   < > 138 139 136  K 4.2   < > 3.4* 3.8 3.4*  CL 102   < > 107 105 103  CO2 23   < > '23 28 26  '$ GLUCOSE 115*   < > 101* 119* 113*  BUN 19   < > 36* 33* 32*   CREATININE 0.99   < > 1.10 1.09 1.12  CALCIUM 9.6   < > 8.9 8.7* 9.0  PROT 8.0  --   --   --   --   ALBUMIN 3.7  --   --   --   --   AST 101*  --   --   --   --   ALT 33  --   --   --   --   ALKPHOS 80  --   --   --   --   BILITOT 2.5*  --   --   --   --   GFRNONAA >60   < > >60 >60 >60  ANIONGAP 12   < > '8 6 7   '$ < > = values in this interval not displayed.    Hematology Recent Labs  Lab 07/22/21 0712 07/23/21 0658 07/24/21 0411  WBC 11.7* 7.7 7.7  RBC 4.35 3.87* 3.91*  HGB 11.9* 10.4* 10.6*  HCT 38.0* 32.9* 33.7*  MCV 87.4 85.0 86.2  MCH 27.4 26.9 27.1  MCHC 31.3 31.6 31.5  RDW 13.6 13.4 13.4  PLT 148* 125* 125*   BNP Recent Labs  Lab 07/21/21 1739  BNP 1,373.1*    DDimer No results for input(s): DDIMER in the last 168 hours.   Radiology  CARDIAC CATHETERIZATION  Result Date: 07/23/2021   Mid Cx lesion is 100% stenosed.   Ost Cx to Prox Cx lesion is 50% stenosed.   Ost RCA to Prox RCA lesion is 100% stenosed.   Mid LAD lesion is 50% stenosed.   Ost LAD to Prox LAD lesion is 40% stenosed. 1.  Heavily calcified coronary arteries with severe two-vessel coronary artery disease including chronically occluded mid left complex with left to left collaterals and occluded ostial right coronary artery with left-to-right collaterals.  The LAD is heavily calcified with moderate nonobstructive disease. 2.  Right heart catheterization showed moderately elevated filling pressures, moderate pulmonary hypertension and normal cardiac output. 3.  Difficult Catheterization via the right radial artery due to tortuous and calcified innominate artery.  I had to use a long sheath to be able to complete the procedure. Recommendations: No clear culprit is identified as both the left circumflex and right coronary occlusions appear to be chronic  with good collaterals.  Recommend aggressive medical therapy.  I added Plavix to be used for 1 year.  The patient continues to be volume overloaded.  I added  furosemide 40 mg twice daily.    Cardiac Studies  LHC 07/23/2021   Mid Cx lesion is 100% stenosed.   Ost Cx to Prox Cx lesion is 50% stenosed.   Ost RCA to Prox RCA lesion is 100% stenosed.   Mid LAD lesion is 50% stenosed.   Ost LAD to Prox LAD lesion is 40% stenosed.   1.  Heavily calcified coronary arteries with severe two-vessel coronary artery disease including chronically occluded mid left complex with left to left collaterals and occluded ostial right coronary artery with left-to-right collaterals.  The LAD is heavily calcified with moderate nonobstructive disease. 2.  Right heart catheterization showed moderately elevated filling pressures, moderate pulmonary hypertension and normal cardiac output. 3.  Difficult Catheterization via the right radial artery due to tortuous and calcified innominate artery.  I had to use a long sheath to be able to complete the procedure.  TTE 07/22/2021  1. Left ventricular ejection fraction, by estimation, is 30 to 35%. The  left ventricle has moderately decreased function. The left ventricle  demonstrates regional wall motion abnormalities (see scoring  diagram/findings for description). There is mild  left ventricular hypertrophy. Left ventricular diastolic parameters are  consistent with Grade II diastolic dysfunction (pseudonormalization).   2. Right ventricular systolic function is mildly reduced. The right  ventricular size is normal. Tricuspid regurgitation signal is inadequate  for assessing PA pressure.   3. The mitral valve is abnormal. Mild to moderate mitral valve  regurgitation. No evidence of mitral stenosis. Moderate mitral annular  calcification.   4. The aortic valve was not well visualized. There is mild calcification  of the aortic valve. There is mild thickening of the aortic valve. Aortic  valve regurgitation is trivial.   5. The inferior vena cava is normal in size with <50% respiratory  variability, suggesting right atrial  pressure of 8 mmHg.   Patient Profile  88 year old male with history of CAD, COPD, systolic heart failure, hypertension, hyperlipidemia who was admitted on 07/21/2021 after being found down.  Course complicated by rhabdomyolysis, non-STEMI and acute on chronic systolic heart failure.  Assessment & Plan   #Non-STEMI -Likely secondary to rhabdomyolysis.  Left heart catheterization on 07/23/2021 demonstrated CTO of the circumflex which is known.  Also now with CTO of the RCA.  Both have collateral flow.  No intervention pursued. -Would recommend to continue medical therapy with aspirin and Plavix. -Currently on a beta-blocker. -On high intensity statin. -No symptoms of chest pain or angina.  #Acute on chronic systolic heart failure, EF 30-35% -Ischemic etiology.  Pulmonary capillary wedge pressure was elevated but has undergone diuresis.  Net -1.2 L.  Euvolemic on exam today. -Transition to Lasix 20 mg daily.  Aldactone 12.5 mg daily added as well.  Jardiance 10 mg daily.  On Coreg 6.25 mg twice daily. -Would recommend consideration for ACE/ARB/ARNI as an outpatient.  Would let him recover from this.  Currently blood pressure is stable.  Given his age unclear how beneficial aggressive medical therapy will be.  [facility] will sign off.   Medication Recommendations: Medical management as above.  No further recommendations to be made. Other recommendations (labs, testing, etc): None. Follow up as an outpatient: We will arrange hospital follow-up in 2 to 4 weeks with Dr. Fletcher Anon.  For questions or updates, please contact [facility] Please consult [email or web address] for contact info under    Signed, [place] T. Audie Box, MD, [place]  07/25/2021 11:28 AM

## 2021-07-26 ENCOUNTER — Telehealth: Payer: Self-pay | Admitting: Cardiovascular Disease

## 2021-07-26 ENCOUNTER — Inpatient Hospital Stay: Payer: Medicare Other

## 2021-07-26 ENCOUNTER — Encounter: Payer: Self-pay | Admitting: Cardiovascular Disease

## 2021-07-26 DIAGNOSIS — I214 Non-ST elevation (NSTEMI) myocardial infarction: Secondary | ICD-10-CM | POA: Diagnosis not present

## 2021-07-26 LAB — MAGNESIUM: Magnesium: 2 mg/dL (ref 1.7–2.4)

## 2021-07-26 LAB — BASIC METABOLIC PANEL
Anion gap: 8 (ref 5–15)
BUN: 30 mg/dL — ABNORMAL HIGH (ref 8–23)
CO2: 26 mmol/L (ref 22–32)
Calcium: 8.9 mg/dL (ref 8.9–10.3)
Chloride: 104 mmol/L (ref 98–111)
Creatinine, Ser: 1.12 mg/dL (ref 0.61–1.24)
GFR, Estimated: >60 mL/min (ref >60)
Glucose, Bld: 125 mg/dL — ABNORMAL HIGH (ref 70–99)
Potassium: 4 mmol/L (ref 3.5–5.1)
Sodium: 138 mmol/L (ref 135–145)

## 2021-07-26 LAB — CULTURE, BLOOD (ROUTINE X 2)
Culture: NO GROWTH
Culture: NO GROWTH

## 2021-07-26 MED ORDER — CARVEDILOL 3.125 MG PO TABS
3.1250 mg | ORAL_TABLET | Freq: Two times a day (BID) | ORAL | Status: DC
  Administered 2021-07-26 – 2021-07-27 (×3): 3.125 mg via ORAL
  Filled 2021-07-26 (×3): qty 1

## 2021-07-26 MED ORDER — SPIRIVA HANDIHALER 18 MCG IN CAPS: 18.0000 ug | ORAL_CAPSULE | Freq: Every day | RESPIRATORY_TRACT | 2 refills | Status: DC

## 2021-07-26 MED ORDER — ATORVASTATIN CALCIUM 80 MG PO TABS: 80.0000 mg | ORAL_TABLET | Freq: Every day | ORAL | 0 refills | Status: DC

## 2021-07-26 MED ORDER — FUROSEMIDE 10 MG/ML IJ SOLN
20.0000 mg | Freq: Two times a day (BID) | INTRAMUSCULAR | Status: DC
  Administered 2021-07-26: 20 mg via INTRAVENOUS
  Filled 2021-07-26: qty 2

## 2021-07-26 MED ORDER — FUROSEMIDE 20 MG PO TABS: 20.0000 mg | ORAL_TABLET | Freq: Every day | ORAL | 0 refills | Status: DC

## 2021-07-26 MED ORDER — CLOPIDOGREL BISULFATE 75 MG PO TABS: 75.0000 mg | ORAL_TABLET | Freq: Every day | ORAL | 0 refills | Status: DC

## 2021-07-26 MED ORDER — BISACODYL 10 MG RE SUPP
10.0000 mg | Freq: Every day | RECTAL | 0 refills | Status: DC | PRN
Start: 2021-07-26 — End: 2022-07-01

## 2021-07-26 MED ORDER — PANTOPRAZOLE SODIUM 40 MG PO TBEC: 40.0000 mg | DELAYED_RELEASE_TABLET | Freq: Every day | ORAL | 0 refills | Status: DC

## 2021-07-26 MED ORDER — DAPAGLIFLOZIN PROPANEDIOL 5 MG PO TABS: 5.0000 mg | ORAL_TABLET | Freq: Every day | ORAL | 0 refills | Status: DC

## 2021-07-26 MED ORDER — POLYETHYLENE GLYCOL 3350 17 G PO PACK: 17.0000 g | PACK | Freq: Every day | ORAL | 0 refills | Status: DC

## 2021-07-26 MED ORDER — MOMETASONE FURO-FORMOTEROL FUM 100-5 MCG/ACT IN AERO: 2.0000 | INHALATION_SPRAY | Freq: Two times a day (BID) | RESPIRATORY_TRACT | 0 refills | Status: DC

## 2021-07-26 MED ORDER — SPIRONOLACTONE 25 MG PO TABS: 12.5000 mg | ORAL_TABLET | Freq: Every day | ORAL | 0 refills | Status: DC

## 2021-07-26 NOTE — Telephone Encounter (Signed)
-----   Message from Theora Gianotti, NP sent at 07/26/2021  7:55 AM EDT ----- Regarding: FW: Follow-up F/u request below - really should be more like a 7 day toc.  Me or Arida ideal, but ok if someone else. ----- Message ----- From: Geralynn Rile, MD Sent: 07/25/2021  11:33 AM EDT To: Theora Gianotti, NP Subject: Follow-up                                      2-4 weeks with Dr. Fletcher Anon.   -Gwynneth Macleod

## 2021-07-26 NOTE — Progress Notes (Signed)
Progress Note Patient: Anthony Johns PNT:[id number] DOB: [birth year] DOA: 07/21/2021  DOS: the patient was seen and examined on 07/26/2021  Brief hospital course: Possible history of CAD, HTN, HFpEF, HLD presented to the hospital with an unwitnessed fall where the patient describes that he lowered himself to the ground but unknown downtime.  Found to have elevated CK as well as troponin which trended up significantly.  Cardiology consulted.  Found to have acute systolic CHF.   Underwent cardiac catheterization on 6/2.  No culprit lesion.  Medically managed. Continue remains volume overloaded requiring IV Lasix. Assessment and Plan: * Non-STEMI (non-ST elevated myocardial infarction) (HCC) Acute combined systolic and diastolic CHF. Presents with an unwitnessed fall.  Appears to have severe shortness of breath Troponin peaked at 18,000. Cardiology consulted. EKG unremarkable for any acute ischemia. Echocardiogram shows a significant drop in EF from normal to 30 to 35% with global hypokinesis and moderate MR. Was on IV heparin. Underwent right and left heart catheterization on 6/2.  No culprit lesion identified, chronic appearing LCx and RCA stenosis with collaterals.  Stenosis in LAD as well. Medical management recommended. Currently on Aldactone, Lasix, Coreg 6.25 mg twice daily, Lipitor 80 mg daily, aspirin 81 mg daily. ACE inhibitor/ARB/ARNI outpatient due to relative hypotension. Chest x-ray continues to show vascular congestion and therefore we will continue to treat with IV Lasix.  Patient's ambulation distance also has reduced.  Rhabdomyolysis Unknown downtime. Patient tells me that he actually lowered himself to the ground rather than having a fall. CK is not significantly elevated.   Occasional tremors Per family patient does have some tremors at his baseline.  Recommend outpatient work-up.  Acute metabolic encephalopathy On admission somewhat confused and poor  historian. Currently mentation significantly improved.  Appears to be close to baseline.  Unwitnessed fall Suspect multifactorial fall.  PT recommends home with home health, patient able to ambulate 150 feet.  Depression Continue Zoloft. On trazodone nightly.  Essential hypertension Patient is on multiple antihypertensive medication at home. Currently continuing Imdur, Coreg, Lasix. Holding losartan and Norvasc.  Chronic respiratory failure with hypoxia (HCC) COPD. On 2 LPM oxygen at home chronically. Due to ongoing shortness of breath, will add Dulera and Incruse and monitor. Home O2 evaluation tomorrow.  Moderate hiatal hernia Incidentally seen on the CT scan.  Monitor.  GERD without esophagitis Continue PPI therapy.  Hyperlipidemia Continue statin  Subjective: No nausea no vomiting but continues to have shortness of breath.  Able to walk only 80 feet today.  Requiring 3 L of oxygen.  Physical Exam: Vitals:   07/26/21 0021 07/26/21 0424 07/26/21 0803 07/26/21 1130  BP: 115/66 118/65 126/72 111/81  Pulse: 80 78 81 70  Resp: (!) 23 (!) '25 18 20  '$ Temp: 98.2 F (36.8 C) 98.3 F (36.8 C)  (!) 97.5 F (36.4 C)  TempSrc:      SpO2: 100% 94% 100% 92%  Weight:      Height:       General: Appear in moderate distress; no visible Abnormal Neck Mass Or lumps, Conjunctiva normal Cardiovascular: S1 and S2 Present, no Murmur, Respiratory: increased respiratory effort, Bilateral Air entry present and bilateral Crackles, no wheezes Abdomen: Bowel Sound present, Non tender  Extremities: no Pedal edema Neurology: alert and oriented to time, place, and person  Gait not checked due to patient safety concerns   Data Reviewed: I have Reviewed nursing notes, Vitals, and Lab results since pt's last encounter. Pertinent lab results CBC, BMP, chest x-ray I have  ordered test including CBC and BMP    Family Communication: None at bedside  Disposition: Status is: Inpatient Remains  inpatient appropriate because: Needing IV diuresis  Author: Berle Mull, MD 07/26/2021 2:34 PM  Please look on [email or web address] to find out who is on call.

## 2021-07-26 NOTE — Progress Notes (Signed)
Physical Therapy Treatment Patient Details Name: Anthony Johns MRN: [id number] DOB: August 26, 1927 Today's Date: 07/26/2021   History of Present Illness Anthony Johns is a 88 y.o. male with medical history significant for HFpEF, CAD, HTN and dyslipidemia, who presented to the emergency room with acute onset of suspected fall per his son that likely happened this morning.  The patient apparently read remained on the ground in the hallway.  His neighbor tried to call him with no answer.  She is then called his daughter will let this son know.  When he came he found him on the floor with urine and feces on him.    PT Comments    OOB with min guard.  He is able to stand and walk with RW and 3 lpm O2 to nursing desk, take seated rest in visitor chair placed at end of hall then walk back to recliner where he remains up with needs met.  Sats remain in low 90's with gait and rest.    Pt stated family is working on hiring help at home but will be able to provide 24 hour care.  He has a rollator at home but would benefit from a RW (may need to clarify with family) and a 3-in-1 commode.  Pt does state he does not need it.  Will need +1 assist for mobility for safety and O2 cord management as he stated he does step on it frequently at home.   Recommendations for follow up therapy are one component of a multi-disciplinary discharge planning process, led by the attending physician.  Recommendations may be updated based on patient status, additional functional criteria and insurance authorization.  Follow Up Recommendations  Home health PT     Assistance Recommended at Discharge Intermittent Supervision/Assistance  Patient can return home with the following A little help with walking and/or transfers;A little help with bathing/dressing/bathroom   Equipment Recommendations  Rolling walker (2 wheels);BSC/3in1    Recommendations for Other Services       Precautions / Restrictions Precautions Precautions:  Fall Restrictions Weight Bearing Restrictions: No     Mobility  Bed Mobility Overal bed mobility: Needs Assistance Bed Mobility: Supine to Sit     Supine to sit: Supervision          Transfers Overall transfer level: Needs assistance Equipment used: Rolling walker (2 wheels) Transfers: Sit to/from Stand Sit to Stand: Supervision, Min guard                Ambulation/Gait Ambulation/Gait assistance: Min guard, Min assist Gait Distance (Feet): 80 Feet Assistive device: Rolling walker (2 wheels) Gait Pattern/deviations: Step-through pattern, Decreased stride length Gait velocity: decreased     General Gait Details: to nursing desk with seated rest in visitor chair placed at end of hallway.   Stairs             Wheelchair Mobility    Modified Rankin (Stroke Patients Only)       Balance Overall balance assessment: Needs assistance Sitting-balance support: No upper extremity supported, Feet supported Sitting balance-Leahy Scale: Good     Standing balance support: No upper extremity supported, During functional activity Standing balance-Leahy Scale: Fair Standing balance comment: some increased lateral sway with gait but does well                            Cognition Arousal/Alertness: Awake/alert Behavior During Therapy: WFL for tasks assessed/performed Overall Cognitive Status: Within Functional Limits  for tasks assessed                                          Exercises      General Comments        Pertinent Vitals/Pain Pain Assessment Pain Assessment: No/denies pain    Home Living                          Prior Function            PT Goals (current goals can now be found in the care plan section) Progress towards PT goals: Progressing toward goals    Frequency    Min 2X/week      PT Plan Current plan remains appropriate    Co-evaluation              AM-PAC PT "6 Clicks"  Mobility   Outcome Measure  Help needed turning from your back to your side while in a flat bed without using bedrails?: None Help needed moving from lying on your back to sitting on the side of a flat bed without using bedrails?: A Little Help needed moving to and from a bed to a chair (including a wheelchair)?: A Little Help needed standing up from a chair using your arms (e.g., wheelchair or bedside chair)?: A Little Help needed to walk in hospital room?: A Little Help needed climbing 3-5 steps with a railing? : A Lot 6 Click Score: 18    End of Session Equipment Utilized During Treatment: Gait belt Activity Tolerance: Patient tolerated treatment well Patient left: in chair;with chair alarm set;with call bell/phone within reach Nurse Communication: Mobility status PT Visit Diagnosis: Other abnormalities of gait and mobility (R26.89);Muscle weakness (generalized) (M62.81);Difficulty in walking, not elsewhere classified (R26.2)     Time: 4276-7011 PT Time Calculation (min) (ACUTE ONLY): 18 min  Charges:  $Gait Training: 8-22 mins                   Chesley Noon, PTA 07/26/21, 11:26 AM

## 2021-07-26 NOTE — Telephone Encounter (Signed)
Currently Admitted

## 2021-07-26 NOTE — Care Management Important Message (Signed)
Important Message  Patient Details  Name: Anthony Johns MRN: [id number] Date of Birth: August 01, 1927   Medicare Important Message Given:  Yes     Dannette Barbara 07/26/2021, 12:09 PM

## 2021-07-26 NOTE — Progress Notes (Signed)
Mobility Specialist - Progress Note   07/26/21 1600  Mobility  Activity Ambulated with assistance in hallway  Level of Assistance Standby assist, set-up cues, supervision of patient - no hands on  Assistive Device Front wheel walker  Distance Ambulated (ft) 100 ft  Activity Response Tolerated well  $Mobility charge 1 Mobility     Pre-mobility: 60 HR, 94% SpO2 During mobility: 79 HR, 86% SpO2 Post-mobility: 73 HR, 91% SpO2   Pt lying in bed upon arrival, utilizing 3L. Pt placed on RA for ambulation trial. O2 desat to 86% when completing bed mobility, very extensive rest break taken & O2 was bumped to 3L with sats returning to 91% prior to ambulation. Pt ambulated in hallway with supervision, vc to pace activity. Winded with activity, seated rest break taken. O2 difficult to determine d/t poor pleth with RN coming over to assist with O2 readings utilizing several devices but without success. O2 seemingly in high 70s on 4L. Pt ambulated back to room and returned semi-supine with alarm set, needs in reach.    Kathee Delton Mobility Specialist 07/26/21, 4:21 PM

## 2021-07-27 ENCOUNTER — Encounter: Payer: Self-pay | Admitting: Family Medicine

## 2021-07-27 DIAGNOSIS — I214 Non-ST elevation (NSTEMI) myocardial infarction: Secondary | ICD-10-CM | POA: Diagnosis not present

## 2021-07-27 DIAGNOSIS — Z7189 Other specified counseling: Secondary | ICD-10-CM

## 2021-07-27 DIAGNOSIS — Z515 Encounter for palliative care: Secondary | ICD-10-CM | POA: Diagnosis not present

## 2021-07-27 DIAGNOSIS — I5041 Acute combined systolic (congestive) and diastolic (congestive) heart failure: Secondary | ICD-10-CM | POA: Diagnosis not present

## 2021-07-27 LAB — BASIC METABOLIC PANEL
Anion gap: 8 (ref 5–15)
BUN: 31 mg/dL — ABNORMAL HIGH (ref 8–23)
CO2: 25 mmol/L (ref 22–32)
Calcium: 8.9 mg/dL (ref 8.9–10.3)
Chloride: 102 mmol/L (ref 98–111)
Creatinine, Ser: 1.26 mg/dL — ABNORMAL HIGH (ref 0.61–1.24)
GFR, Estimated: 54 mL/min — ABNORMAL LOW (ref >60)
Glucose, Bld: 109 mg/dL — ABNORMAL HIGH (ref 70–99)
Potassium: 3.7 mmol/L (ref 3.5–5.1)
Sodium: 135 mmol/L (ref 135–145)

## 2021-07-27 LAB — HEPATIC FUNCTION PANEL
ALT: 23 U/L (ref 0–44)
AST: 28 U/L (ref 15–41)
Albumin: 2.7 g/dL — ABNORMAL LOW (ref 3.5–5.0)
Alkaline Phosphatase: 62 U/L (ref 38–126)
Bilirubin, Direct: 0.3 mg/dL — ABNORMAL HIGH (ref 0.0–0.2)
Indirect Bilirubin: 1.2 mg/dL — ABNORMAL HIGH (ref 0.3–0.9)
Total Bilirubin: 1.5 mg/dL — ABNORMAL HIGH (ref 0.3–1.2)
Total Protein: 6.7 g/dL (ref 6.5–8.1)

## 2021-07-27 LAB — BRAIN NATRIURETIC PEPTIDE: B Natriuretic Peptide: 3306.7 pg/mL — ABNORMAL HIGH (ref 0.0–100.0)

## 2021-07-27 LAB — MAGNESIUM: Magnesium: 2.1 mg/dL (ref 1.7–2.4)

## 2021-07-27 MED ORDER — FUROSEMIDE 10 MG/ML IJ SOLN
40.0000 mg | Freq: Two times a day (BID) | INTRAMUSCULAR | Status: DC
  Administered 2021-07-27 – 2021-07-28 (×3): 40 mg via INTRAVENOUS
  Filled 2021-07-27 (×3): qty 4

## 2021-07-27 NOTE — Consult Note (Cosign Needed)
Consultation Note Date: 07/27/2021   Patient Name: Anthony Johns  DOB: 08-03-1927  MRN: [id number]  Age / Sex: 88 y.o., male  PCP: Baxter Hire, MD Referring Physician: Lavina Hamman, MD  Reason for Consultation:  "GOC"  HPI/Patient Profile: 88 y.o. male  with past medical history of HTN, HLD, CAD, HF, COPD on home oxygen at 2L admitted on 07/21/2021 after being found down at home from a fall. Workup reveals rhabdomyolysis,  likely NSTEMI- underwent cardiac cath on 6/2, acute CHF with EF 30-35%. Palliative medicine consulted for [place].   Primary Decision Maker PATIENT and next of kin- daughter [name] per chart  Discussion: Chart reviewed including labs, imaging, progress notes.  Prior to admission patient was living at home independently. Driving himself to his appointments.  He continues to be diuresed, is walking with PT, had some increase in his Cr today to 1.26. Recommendations have been made for discharge home with HH/PT.  On evaluation he was sleeping comfortably, he did not wake to my voice or light touch- no family at bedside.  He does not have advance directives on chart- but he does have a DNR order in place. Called patient's daughter- left message requesting return call.    SUMMARY OF RECOMMENDATIONS -Continue current plan -Recommend GOC discussion with patient and family together given patient's likely new functional status in the setting of multiple comorbidities and advanced age -He has DNR in place -PMT will continue to attempt [place] discussion- recommend outpatient referral to Palliative if patient is discharged prior to completions this admission    Code Status/Advance Care Planning: DNR   Prognosis:   Unable to determine  Discharge Planning: To Be Determined  Primary Diagnoses: Present on Admission:  Rhabdomyolysis  Non-STEMI (non-ST elevated myocardial infarction) ([place])   Essential hypertension  Hyperlipidemia  Depression  GERD without esophagitis  Unwitnessed fall  Acute combined systolic and diastolic congestive heart failure (HCC)  Acute metabolic encephalopathy  Occasional tremors  Chronic respiratory failure with hypoxia (HCC)   Review of Systems  Physical Exam  Vital Signs: BP 125/70 (BP Location: Left Arm)   Pulse (!) 59   Temp 98.1 F (36.7 C)   Resp 18   Ht '5\' 6"'$  (1.676 m)   Wt 76.6 kg   SpO2 95%   BMI 27.25 kg/m  Pain Scale: 0-10   Pain Score: 0-No pain   SpO2: SpO2: 95 % O2 Device:SpO2: 95 % O2 Flow Rate: .O2 Flow Rate (L/min): 3 L/min  IO: Intake/output summary:  Intake/Output Summary (Last 24 hours) at 07/27/2021 1547 Last data filed at 07/27/2021 0900 Gross per 24 hour  Intake 240 ml  Output 500 ml  Net -260 ml    LBM: Last BM Date : 07/27/21 Baseline Weight: Weight: 78.8 kg Most recent weight: Weight: 76.6 kg       Thank you for this consult. Palliative medicine will continue to follow and assist as needed.  Time Total: 60 minutes Greater than 50%  of this time was spent counseling and  coordinating care related to the above assessment and plan.  Signed by: Mariana Kaufman, AGNP-C Palliative Medicine    Please contact Palliative Medicine Team phone at [phone] for questions and concerns.  For individual provider: See Shea Evans

## 2021-07-27 NOTE — Assessment & Plan Note (Signed)
Prognosis poor in the setting of HTN, HLD and COPD on chronic oxygen now with multivessel CAD with ischemic cardiomyopathy EF of 30%. Discussed with patient and wants to go home regardless of the outcome. He understands his current medical condition and wants to go home, sit in his recliner and spend time at his home and if his condition worsens would prefer to rather die a natural death at home. Switch to DNR. Palliative care consulted. Hospice consulted.  Plan is to go home with hospice.  Discussed with daughter [name].

## 2021-07-27 NOTE — Progress Notes (Signed)
Progress Note Patient: Anthony Johns MEQ:[id number] DOB: 12-09-1927 DOA: 07/21/2021  DOS: the patient was seen and examined on 07/27/2021  Brief hospital course: Possible history of CAD, HTN, HFpEF, HLD presented to the hospital with an unwitnessed fall where the patient describes that he lowered himself to the ground but unknown downtime.  Found to have elevated CK as well as troponin which trended up significantly.  Cardiology consulted.  Found to have acute systolic CHF.   Underwent cardiac catheterization on 6/2.  No culprit lesion.  Medically managed. Continue remains volume overloaded requiring IV Lasix. 6/6.  He wants to go home regardless of the outcome.  Further about goals of care discussion done with daughter.  Currently plan is to go home with hospice on 6/7.  Hospice formally consulted.  Palliative care consulted. Assessment and Plan: * Non-STEMI (non-ST elevated myocardial infarction) (HCC) Acute combined systolic and diastolic CHF. Presents with an unwitnessed fall.  Appears to have severe shortness of breath Troponin peaked at 18,000. Cardiology consulted. EKG unremarkable for any acute ischemia. Echocardiogram shows a significant drop in EF from normal to 30 to 35% with global hypokinesis and moderate MR. Was on IV heparin. Underwent right and left heart catheterization on 6/2.  No culprit lesion identified, chronic appearing LCx and RCA stenosis with collaterals.  Stenosis in LAD as well. Medical management recommended. Currently on Aldactone, Lasix, Coreg 6.25 mg twice daily, Lipitor 80 mg daily, aspirin 81 mg daily. ACE inhibitor/ARB/ARNI outpatient due to relative hypotension. Chest x-ray continues to show vascular congestion and therefore we will continue to treat with IV Lasix.  Rhabdomyolysis Unknown downtime. Patient tells me that he actually lowered himself to the ground rather than having a fall. CK is not significantly elevated.   Occasional tremors Per family  patient does have some tremors at his baseline.  Recommend outpatient work-up.  Acute metabolic encephalopathy On admission somewhat confused and poor historian. Currently mentation significantly improved.  Appears to be close to baseline.  Unwitnessed fall Suspect multifactorial fall.  PT recommends home with home health, patient able to ambulate 150 feet.  Depression Continue Zoloft. On trazodone nightly.  Essential hypertension Patient is on multiple antihypertensive medication at home. Currently continuing Imdur, Coreg, Lasix. Holding losartan and Norvasc.  Goals of care, counseling/discussion Prognosis poor in the setting of HTN, HLD and COPD on chronic oxygen now with multivessel CAD with ischemic cardiomyopathy EF of 30%. Discussed with patient and wants to go home regardless of the outcome. He understands his current medical condition and wants to go home, sit in his recliner and spend time at his home and if his condition worsens would prefer to rather die a natural death at home. Switch to DNR. Palliative care consulted. Hospice consulted.  Plan is to go home with hospice.  Discussed with daughter [name].  Chronic respiratory failure with hypoxia (HCC) COPD. On 2 LPM oxygen at home chronically. Due to ongoing shortness of breath, will add Dulera and Incruse and monitor.  Moderate hiatal hernia Incidentally seen on the CT scan.  Monitor.  GERD without esophagitis Continue PPI therapy.  Hyperlipidemia Continue statin  Subjective: Continues to have shortness of breath.  No nausea no vomiting.  Continues to have fatigue and tiredness.  No chest pain or chest tightness.  Has cough.  Physical Exam: Vitals:   07/27/21 0734 07/27/21 1133 07/27/21 1530 07/27/21 1952  BP: 120/65 (!) 118/59 125/70 103/87  Pulse: 71 66 (!) 59 69  Resp: '18 15 18 20  '$ Temp:  98 F (36.7 C) 98 F (36.7 C) 98.1 F (36.7 C) 98 F (36.7 C)  TempSrc: Oral     SpO2: 95% 92% 95% 96%  Weight:       Height:       General: Appear in moderate distress; no visible Abnormal Neck Mass Or lumps, Conjunctiva normal Cardiovascular: S1 and S2 Present, no Murmur, Respiratory: increased respiratory effort, Bilateral Air entry present and  bilateral Crackles, no wheezes Abdomen: Bowel Sound present, Non tender Extremities: no Pedal edema Neurology: alert and oriented to time, place, and person  Gait not checked due to patient safety concerns   Data Reviewed: I have Reviewed nursing notes, Vitals, and Lab results since pt's last encounter. Pertinent lab results CBC and BMP I have ordered test including CBC and BMP and BNP I have discussed pt's care plan and test results with cardiology, palliative care.   Family Communication: Discussed with daughter  Disposition: Status is: Inpatient Remains inpatient appropriate because: Requiring aggressive IV diuresis and further discussion of goals of care.  Author: Berle Mull, MD 07/27/2021 8:23 PM  Please look on [email or web address] to find out who is on call.

## 2021-07-27 NOTE — Progress Notes (Signed)
Physical Therapy Treatment Patient Details Name: Anthony Johns MRN: [id number] DOB: [birth year] Today's Date: 07/27/2021   History of Present Illness SAID Anthony Johns is a 88 y.o. male with medical history significant for HFpEF, CAD, HTN and dyslipidemia, who presented to the emergency room with acute onset of suspected fall per his son that likely happened this morning.  The patient apparently read remained on the ground in the hallway.  His neighbor tried to call him with no answer.  She is then called his daughter will let this son know.  When he came he found him on the floor with urine and feces on him.    PT Comments    Pt initially not agreeable to therapy session as he states he is going home tomorrow, but with encouragement, he agreed to participate.  Visitor chair was stationed at [place] for pt to ambulate to and receive a seated rest break.  Pt performed well and noted that he could go further.  Pt ambulated to the end of the hallway and back to the chair and took another seated rest break, and then stated he would walk around the nursing station.  Pt ambulated around the nursing station and to the chair and was visibly fatigued and required a long seated rest break.  Pt able to participate in ankle pumps and other exercises while seated.  Pt then ambulated back to room with family friend present.  Pt transferred without assistance but needed verbal cuing for body placement in bed.  Pt left with all needs met and call bell within reach.  Current discharge plans to HHPT remain appropriate at this time.  Pt will continue to benefit from skilled therapy in order to address deficits listed below.     Recommendations for follow up therapy are one component of a multi-disciplinary discharge planning process, led by the attending physician.  Recommendations may be updated based on patient status, additional functional criteria and insurance authorization.  Follow Up Recommendations  Home  health PT     Assistance Recommended at Discharge Intermittent Supervision/Assistance  Patient can return home with the following A little help with walking and/or transfers;A little help with bathing/dressing/bathroom   Equipment Recommendations  Rolling walker (2 wheels);BSC/3in1    Recommendations for Other Services       Precautions / Restrictions Precautions Precautions: Fall Restrictions Weight Bearing Restrictions: No     Mobility  Bed Mobility Overal bed mobility: Needs Assistance Bed Mobility: Supine to Sit     Supine to sit: Supervision          Transfers Overall transfer level: Needs assistance Equipment used: Rolling walker (2 wheels) Transfers: Sit to/from Stand Sit to Stand: Supervision, Min guard                Ambulation/Gait Ambulation/Gait assistance: Min guard, Min assist Gait Distance (Feet): 280 Feet (3 separate bouts of ambulation.) Assistive device: Rolling walker (2 wheels) Gait Pattern/deviations: Step-through pattern, Decreased stride length Gait velocity: decreased     General Gait Details: to nursing desk with seated rest in visitor chair placed at nursing station.  Then to end of hallway and back to nursing station before third bout of ambulation.  Pt able to ambulate around the nursing station back to chair, then to room after extended seated break.   Stairs             Wheelchair Mobility    Modified Rankin (Stroke Patients Only)       Balance  Overall balance assessment: Needs assistance Sitting-balance support: No upper extremity supported, Feet supported Sitting balance-Leahy Scale: Good     Standing balance support: No upper extremity supported, During functional activity Standing balance-Leahy Scale: Fair Standing balance comment: some increased lateral sway with gait but does well                            Cognition Arousal/Alertness: Awake/alert Behavior During Therapy: WFL for tasks  assessed/performed Overall Cognitive Status: Within Functional Limits for tasks assessed                                          Exercises      General Comments        Pertinent Vitals/Pain Pain Assessment Pain Assessment: No/denies pain    Home Living                          Prior Function            PT Goals (current goals can now be found in the care plan section) Progress towards PT goals: Progressing toward goals    Frequency    Min 2X/week      PT Plan Current plan remains appropriate    Co-evaluation              AM-PAC PT "6 Clicks" Mobility   Outcome Measure  Help needed turning from your back to your side while in a flat bed without using bedrails?: None Help needed moving from lying on your back to sitting on the side of a flat bed without using bedrails?: A Little Help needed moving to and from a bed to a chair (including a wheelchair)?: A Little Help needed standing up from a chair using your arms (e.g., wheelchair or bedside chair)?: A Little Help needed to walk in hospital room?: A Little Help needed climbing 3-5 steps with a railing? : A Lot 6 Click Score: 18    End of Session Equipment Utilized During Treatment: Gait belt Activity Tolerance: Patient tolerated treatment well Patient left: in chair;with chair alarm set;with call bell/phone within reach Nurse Communication: Mobility status PT Visit Diagnosis: Other abnormalities of gait and mobility (R26.89);Muscle weakness (generalized) (M62.81);Difficulty in walking, not elsewhere classified (R26.2)     Time: 7544-9201 PT Time Calculation (min) (ACUTE ONLY): 42 min  Charges:  $Gait Training: 38-52 mins                     Gwenlyn Saran, PT, DPT 07/27/21, 2:15 PM    Christie Nottingham 07/27/2021, 2:12 PM

## 2021-07-28 DIAGNOSIS — T796XXA Traumatic ischemia of muscle, initial encounter: Secondary | ICD-10-CM | POA: Diagnosis not present

## 2021-07-28 DIAGNOSIS — I5041 Acute combined systolic (congestive) and diastolic (congestive) heart failure: Secondary | ICD-10-CM | POA: Diagnosis not present

## 2021-07-28 DIAGNOSIS — I214 Non-ST elevation (NSTEMI) myocardial infarction: Secondary | ICD-10-CM | POA: Diagnosis not present

## 2021-07-28 DIAGNOSIS — W19XXXA Unspecified fall, initial encounter: Secondary | ICD-10-CM | POA: Diagnosis not present

## 2021-07-28 LAB — BASIC METABOLIC PANEL
Anion gap: 8 (ref 5–15)
BUN: 36 mg/dL — ABNORMAL HIGH (ref 8–23)
CO2: 26 mmol/L (ref 22–32)
Calcium: 8.7 mg/dL — ABNORMAL LOW (ref 8.9–10.3)
Chloride: 101 mmol/L (ref 98–111)
Creatinine, Ser: 1.39 mg/dL — ABNORMAL HIGH (ref 0.61–1.24)
GFR, Estimated: 48 mL/min — ABNORMAL LOW (ref >60)
Glucose, Bld: 111 mg/dL — ABNORMAL HIGH (ref 70–99)
Potassium: 3.4 mmol/L — ABNORMAL LOW (ref 3.5–5.1)
Sodium: 135 mmol/L (ref 135–145)

## 2021-07-28 LAB — MAGNESIUM: Magnesium: 2.2 mg/dL (ref 1.7–2.4)

## 2021-07-28 LAB — LIPOPROTEIN A (LPA): Lipoprotein (a): 66.8 nmol/L — ABNORMAL HIGH (ref <75.0)

## 2021-07-28 MED ORDER — POTASSIUM CHLORIDE CRYS ER 20 MEQ PO TBCR
40.0000 meq | EXTENDED_RELEASE_TABLET | Freq: Once | ORAL | Status: AC
  Administered 2021-07-28: 40 meq via ORAL
  Filled 2021-07-28: qty 2

## 2021-07-28 MED ORDER — CARVEDILOL 6.25 MG PO TABS
6.2500 mg | ORAL_TABLET | Freq: Two times a day (BID) | ORAL | Status: DC
  Administered 2021-07-28: 6.25 mg via ORAL
  Filled 2021-07-28: qty 1

## 2021-07-28 NOTE — TOC Transition Note (Signed)
Transition of Care [facility]) - CM/SW Discharge Note   Patient Details  Name: Anthony Johns MRN: [id number] Date of Birth: 09-28-1927  [facility]) CM/SW Contact:  Laurena Slimmer, RN Phone Number: 07/28/2021, 1:48 PM   Clinical Narrative:    Damaris Schooner with [name], Patient's daughter regarding transporatation home. Patient will be transport by EMS. No estimated time frame provided by EMS. TOC signing off.    Final next level of care: [place] Barriers to Discharge: Continued Medical Work up   Patient Goals and CMS Choice     Choice offered to / list presented to : Patient  Discharge Placement                       Discharge Plan and Services   Discharge Planning Services: CM Consult            DME Arranged: Gilford Rile DME Agency: [employer] Date DME Agency Contacted: 07/25/21 Time DME Agency Contacted: 0312 Representative spoke with at DME Agency: [place]: PT [place]: [place] Date [place]: 07/25/21 Time [place]: 1024 Representative spoke with at [place]: [place] Determinants of Health ([place]) Interventions     Readmission Risk Interventions     View : No data to display.

## 2021-07-28 NOTE — Progress Notes (Signed)
This patient has had 8 runs of V-tach. He is asymptomatic on physical assessment and vitals are stable. BP: 113/75, MAP 88, Pulse 55, Oxygen Sat 95% on 2L.  Dr Damita Dunnings informed. Potassium and Coreg ordered and administered.

## 2021-07-28 NOTE — Treatment Plan (Signed)
Floor coverage note:  Patient had an 8 beat run of V. tach that was asymptomatic.  Labs reviewed.  Potassium 3.4, magnesium 2.2  Plan: Potassium chloride 40 mEq p.o. x1 dose Increase Coreg to 6.25 mg p.o. twice daily, first dose now Continue to monitor

## 2021-07-28 NOTE — TOC Progression Note (Signed)
Transition of Care [facility]) - Progression Note    Patient Details  Name: Anthony Johns MRN: [id number] Date of Birth: 1927/06/05  [facility]) CM/SW Contact  Laurena Slimmer, RN Phone Number: 07/28/2021, 10:11 AM  Clinical Narrative:    Spoke with patient's daughter [name] regarding transportation to patient's home. Patient will be transported by EMS to his home.     Expected Discharge Plan: [place] Barriers to Discharge: Continued Medical Work up  Expected Discharge Plan and Services Expected Discharge Plan: [place]   Discharge Planning Services: CM Consult                     DME Arranged: Gilford Rile DME Agency: [employer] Date DME Agency Contacted: 07/25/21 Time DME Agency Contacted: [id number] Representative spoke with at DME Agency: [place]: PT [place]: [place] Date [place]: 07/25/21 Time [place]: 1024 Representative spoke with at [place]: [place] Determinants of Health ([place]) Interventions    Readmission Risk Interventions     View : No data to display.

## 2021-07-28 NOTE — Progress Notes (Signed)
[facility])  Referral received for hospice services at home once discharged.  Spoke with daughter Docia Barrier, discussed hospice services and philosophy. Docia Barrier is well versed and states her father would like to just pass in his sleep and have one good day at home if possible.  DME discussed, he has O2 currently and a walker. No additional DME needed in order to go home today.   He will need EMS transport to get home.   Thank you, Venia Carbon DNP, RN [facility] Liaison

## 2021-07-30 NOTE — Discharge Summary (Signed)
Physician Discharge Summary   Patient: Anthony Johns MRN: [id number] DOB: August 08, 1927  Admit date:     07/21/2021  Discharge date: 07/28/2021  Discharge Physician: Max Sane   PCP: Baxter Hire, MD   Recommendations at discharge:   Follow-up with outpatient providers as requested  Discharge Diagnoses: Principal Problem:   Non-STEMI (non-ST elevated myocardial infarction) ([place]) Active Problems:   Acute combined systolic and diastolic congestive heart failure (HCC)   Rhabdomyolysis   Depression   Fall   Acute metabolic encephalopathy   Occasional tremors   Essential hypertension   Hyperlipidemia   GERD without esophagitis   Moderate hiatal hernia   Chronic respiratory failure with hypoxia [facility])   Hospice care  Hospital Course: Possible history of CAD, HTN, HFpEF, HLD presented to the hospital with an unwitnessed fall where the patient describes that he lowered himself to the ground but unknown downtime.  Found to have elevated CK as well as troponin which trended up significantly.  Cardiology consulted.  Found to have acute systolic CHF.   Underwent cardiac catheterization on 6/2.  No culprit lesion.  Medically managed. Continue remains volume overloaded requiring IV Lasix. 6/6.  He wants to go home regardless of the outcome.  Further about goals of care discussion done with daughter.  Currently plan is to go home with hospice on 6/7.  Hospice formally consulted.  Palliative care consulted.  Assessment and Plan: Non-STEMI (non-ST elevated myocardial infarction) (HCC) Acute combined systolic and diastolic CHF. Rhabdomyolysis Occasional tremors Acute metabolic encephalopathy Fall Depression Essential hypertension Hospice care Chronic respiratory failure with hypoxia (HCC) COPD. Moderate hiatal hernia GERD without esophagitis Hyperlipidemia         Consultants: Palliative care, cardiology Disposition: Hospice care at home Diet recommendation:  Discharge Diet  Orders (From admission, onward)     Start     Ordered   07/26/21 0000  Diet - low sodium heart healthy        07/26/21 1214           Carb modified diet DISCHARGE MEDICATION: Allergies as of 07/28/2021       Reactions   Tape         Medication List     STOP taking these medications    amLODipine 10 MG tablet Commonly known as: NORVASC   isosorbide mononitrate 30 MG 24 hr tablet Commonly known as: IMDUR   losartan 100 MG tablet Commonly known as: COZAAR   magnesium oxide 400 MG tablet Commonly known as: MAG-OX   omeprazole 20 MG capsule Commonly known as: PRILOSEC Replaced by: pantoprazole 40 MG tablet   simvastatin 20 MG tablet Commonly known as: ZOCOR       TAKE these medications    aspirin EC 81 MG tablet Take 81 mg by mouth daily.   atorvastatin 80 MG tablet Commonly known as: LIPITOR Take 1 tablet (80 mg total) by mouth daily.   bisacodyl 10 MG suppository Commonly known as: DULCOLAX Place 1 suppository (10 mg total) rectally daily as needed for moderate constipation.   carvedilol 6.25 MG tablet Commonly known as: COREG Take 1 tablet (6.25 mg total) by mouth 2 (two) times daily.   clopidogrel 75 MG tablet Commonly known as: PLAVIX Take 1 tablet (75 mg total) by mouth daily with breakfast.   cyanocobalamin 1000 MCG tablet Take by mouth.   dapagliflozin propanediol 5 MG Tabs tablet Commonly known as: Farxiga Take 1 tablet (5 mg total) by mouth daily before breakfast.   furosemide  20 MG tablet Commonly known as: LASIX Take 1 tablet (20 mg total) by mouth daily. Take extra 20 mg tablet, for weight gain of 3lbs in 1 day or 5lbs in 1 week   mometasone-formoterol 100-5 MCG/ACT Aero Commonly known as: DULERA Inhale 2 puffs into the lungs 2 (two) times daily.   nitroGLYCERIN 0.4 MG SL tablet Commonly known as: NITROSTAT Place 0.4 mg under the tongue every 5 (five) minutes as needed.   pantoprazole 40 MG tablet Commonly known as:  PROTONIX Take 1 tablet (40 mg total) by mouth daily before breakfast. Replaces: omeprazole 20 MG capsule   polyethylene glycol 17 g packet Commonly known as: MIRALAX / GLYCOLAX Take 17 g by mouth daily.   sertraline 50 MG tablet Commonly known as: ZOLOFT Take 50 mg by mouth daily. What changed: Another medication with the same name was removed. Continue taking this medication, and follow the directions you see here.   Spiriva HandiHaler 18 MCG inhalation capsule Generic drug: tiotropium Place 1 capsule (18 mcg total) into inhaler and inhale daily.   spironolactone 25 MG tablet Commonly known as: ALDACTONE Take 0.5 tablets (12.5 mg total) by mouth daily.        Follow-up Information     Baxter Hire, MD. Go in 1 week(s).   Specialty: Internal Medicine Why: Appointment on 08/02/21 at 2:30pm with Dr. Karlene Einstein information: [place] [address] [phone]         Wellington Hampshire, MD. Schedule an appointment as soon as possible for a visit in 2 week(s).   Specialty: Cardiology Why: APPT on 08/17/21 at 10:30am with Ed Blalock, PA Contact information: [place] [address] [phone]                Discharge Exam: Danley Danker Weights   07/21/21 2119 07/22/21 1547  Weight: 78.8 kg 76.6 kg   General: Appear in moderate distress; no visible Abnormal Neck Mass Or lumps, Conjunctiva normal Cardiovascular: S1 and S2 Present, no Murmur, Respiratory: increased respiratory effort, Bilateral Air entry present and  bilateral Crackles, no wheezes Abdomen: Bowel Sound present, Non tender Extremities: no Pedal edema Neurology: alert and oriented to time, place, and person    Condition at discharge: fair  The results of significant diagnostics from this hospitalization (including imaging, microbiology, ancillary and laboratory) are listed below for reference.   Imaging Studies: DG Chest 2  View  Result Date: 07/26/2021 CLINICAL DATA:  Shortness of breath EXAM: CHEST - 2 VIEW COMPARISON:  07/22/2021 radiography.  CT 11/14/2019 FINDINGS: Heart size is normal. Chronic aortic atherosclerotic calcification. Chronic emphysema and pulmonary scarring. Small amount of pleural fluid in the posterior costophrenic angles. Left lower lobe atelectasis and or pneumonia, worsened since the study of 4 days ago. IMPRESSION: Background pattern of chronic lung disease. Bilateral effusions now present with atelectasis and or infiltrate in the left lower lobe. Findings could be due to congestive heart failure superimposed upon chronic lung disease, with or without left lower lobe pneumonia. Electronically Signed   By: Nelson Chimes M.D.   On: 07/26/2021 14:07   CARDIAC CATHETERIZATION  Result Date: 07/23/2021   Mid Cx lesion is 100% stenosed.   Ost Cx to Prox Cx lesion is 50% stenosed.   Ost RCA to Prox RCA lesion is 100% stenosed.   Mid LAD lesion is 50% stenosed.   Ost LAD to Prox LAD lesion is 40% stenosed. 1.  Heavily calcified coronary arteries with  severe two-vessel coronary artery disease including chronically occluded mid left complex with left to left collaterals and occluded ostial right coronary artery with left-to-right collaterals.  The LAD is heavily calcified with moderate nonobstructive disease. 2.  Right heart catheterization showed moderately elevated filling pressures, moderate pulmonary hypertension and normal cardiac output. 3.  Difficult Catheterization via the right radial artery due to tortuous and calcified innominate artery.  I had to use a long sheath to be able to complete the procedure. Recommendations: No clear culprit is identified as both the left circumflex and right coronary occlusions appear to be chronic with good collaterals.  Recommend aggressive medical therapy.  I added Plavix to be used for 1 year.  The patient continues to be volume overloaded.  I added furosemide 40 mg twice  daily.   ECHOCARDIOGRAM COMPLETE  Result Date: 07/22/2021    ECHOCARDIOGRAM REPORT   Patient Name:   GAYNOR FERRERAS [facility] Date of Exam: 07/22/2021 Medical Rec #:  [id number]       Height:       66.0 in Accession #:    [id number]      Weight:       173.7 lb Date of Birth:  06-05-1927       BSA:          1.884 m Patient Age:    66 years        BP:           103/69 mmHg Patient Gender: M               HR:           93 bpm. Exam Location:  [facility] Procedure: 2D Echo, Color Doppler, Cardiac Doppler and Intracardiac            Opacification Agent Indications:     I21.4 NSTEMI  History:         Patient has prior history of Echocardiogram examinations.                  HFpEF, CAD and Previous Myocardial Infarction; Risk                  Factors:Hypertension, Dyslipidemia and Former Smoker.  Sonographer:     Charmayne Sheer Referring Phys:  [id number] JAN A MANSY Diagnosing Phys: Harrell Gave End MD  Sonographer Comments: Suboptimal parasternal window and suboptimal subcostal window. IMPRESSIONS  1. Left ventricular ejection fraction, by estimation, is 30 to 35%. The left ventricle has moderately decreased function. The left ventricle demonstrates regional wall motion abnormalities (see scoring diagram/findings for description). There is mild left ventricular hypertrophy. Left ventricular diastolic parameters are consistent with Grade II diastolic dysfunction (pseudonormalization).  2. Right ventricular systolic function is mildly reduced. The right ventricular size is normal. Tricuspid regurgitation signal is inadequate for assessing PA pressure.  3. The mitral valve is abnormal. Mild to moderate mitral valve regurgitation. No evidence of mitral stenosis. Moderate mitral annular calcification.  4. The aortic valve was not well visualized. There is mild calcification of the aortic valve. There is mild thickening of the aortic valve. Aortic valve regurgitation is trivial.  5. The inferior vena cava is normal in size with <50% respiratory  variability, suggesting right atrial pressure of 8 mmHg. FINDINGS  Left Ventricle: Left ventricular ejection fraction, by estimation, is 30 to 35%. The left ventricle has moderately decreased function. The left ventricle demonstrates regional wall motion abnormalities. Definity contrast agent was given IV to delineate the left ventricular endocardial borders.  The left ventricular internal cavity size was normal in size. There is mild left ventricular hypertrophy. Left ventricular diastolic parameters are consistent with Grade II diastolic dysfunction (pseudonormalization).  LV Wall Scoring: The mid and distal anterior wall, mid and distal anterior septum, mid inferoseptal segment, and apex are akinetic. The basal anteroseptal segment, basal anterior segment, apical inferior segment, and basal inferoseptal segment are hypokinetic. The entire lateral wall and inferior wall are normal. Right Ventricle: The right ventricular size is normal. Right vetricular wall thickness was not well visualized. Right ventricular systolic function is mildly reduced. Tricuspid regurgitation signal is inadequate for assessing PA pressure. Left Atrium: Left atrial size was normal in size. Right Atrium: Right atrial size was normal in size. Pericardium: There is no evidence of pericardial effusion. Mitral Valve: The mitral valve is abnormal. Moderate mitral annular calcification. Mild to moderate mitral valve regurgitation. No evidence of mitral valve stenosis. MV peak gradient, 8.4 mmHg. The mean mitral valve gradient is 4.0 mmHg. Tricuspid Valve: The tricuspid valve is grossly normal. Tricuspid valve regurgitation is mild. Aortic Valve: The aortic valve was not well visualized. There is mild calcification of the aortic valve. There is mild thickening of the aortic valve. Aortic valve regurgitation is trivial. Aortic valve mean gradient measures 4.0 mmHg. Aortic valve peak gradient measures 8.0 mmHg. Aortic valve area, by VTI measures  2.15 cm. Pulmonic Valve: The pulmonic valve was not well visualized. Pulmonic valve regurgitation is not visualized. No evidence of pulmonic stenosis. Aorta: The aortic root is normal in size and structure. Venous: The inferior vena cava is normal in size with less than 50% respiratory variability, suggesting right atrial pressure of 8 mmHg. IAS/Shunts: The interatrial septum was not well visualized.  LEFT VENTRICLE PLAX 2D LVIDd:         4.23 cm      Diastology LVIDs:         3.73 cm      LV e' medial:    3.15 cm/s LV PW:         1.19 cm      LV E/e' medial:  44.4 LV IVS:        1.06 cm      LV e' lateral:   7.40 cm/s LVOT diam:     2.00 cm      LV E/e' lateral: 18.9 LV SV:         50 LV SV Index:   27           2D Longitudinal Strain LVOT Area:     3.14 cm     2D Strain GLS Avg:     -4.4 %  LV Volumes (MOD) LV vol d, MOD A2C: 138.0 ml LV vol d, MOD A4C: 145.0 ml LV vol s, MOD A2C: 79.8 ml LV vol s, MOD A4C: 80.2 ml LV SV MOD A2C:     58.2 ml LV SV MOD A4C:     145.0 ml LV SV MOD BP:      62.0 ml RIGHT VENTRICLE RV Basal diam:  3.86 cm RV S prime:     7.29 cm/s LEFT ATRIUM             Index        RIGHT ATRIUM           Index LA Vol (A2C):   36.9 ml 19.59 ml/m  RA Area:     14.50 cm LA Vol (A4C):   33.4 ml 17.73 ml/m  RA Volume:  37.00 ml  19.64 ml/m LA Biplane Vol: 35.6 ml 18.90 ml/m  AORTIC VALVE                    PULMONIC VALVE AV Area (Vmax):    2.09 cm     PV Vmax:       0.70 m/s AV Area (Vmean):   1.94 cm     PV Vmean:      49.300 cm/s AV Area (VTI):     2.15 cm     PV VTI:        0.106 m AV Vmax:           141.00 cm/s  PV Peak grad:  1.9 mmHg AV Vmean:          97.300 cm/s  PV Mean grad:  1.0 mmHg AV VTI:            0.232 m AV Peak Grad:      8.0 mmHg AV Mean Grad:      4.0 mmHg LVOT Vmax:         93.80 cm/s LVOT Vmean:        60.000 cm/s LVOT VTI:          0.159 m LVOT/AV VTI ratio: 0.69  AORTA Ao Root diam: 3.03 cm MITRAL VALVE MV Area (PHT): 8.07 cm     SHUNTS MV Area VTI:   1.42 cm      Systemic VTI:  0.16 m MV Peak grad:  8.4 mmHg     Systemic Diam: 2.00 cm MV Mean grad:  4.0 mmHg MV Vmax:       1.45 m/s MV Vmean:      93.5 cm/s MV Decel Time: 94 msec MV E velocity: 140.00 cm/s MV A velocity: 118.00 cm/s MV E/A ratio:  1.19 Nelva Bush MD Electronically signed by Nelva Bush MD Signature Date/Time: 07/22/2021/3:30:01 PM    Final    DG Chest Port 1 View  Result Date: 07/22/2021 CLINICAL DATA:  Shortness of breath EXAM: PORTABLE CHEST 1 VIEW COMPARISON:  Chest x-ray dated Jul 21, 2021 FINDINGS: Cardiac and mediastinal contours are unchanged. New bilateral heterogeneous opacities with more focal consolidation of the right lower lobe. No large pleural effusion or pneumothorax. IMPRESSION: New bilateral heterogeneous opacities with more focal consolidation of the right lower lobe, differential considerations include pulmonary edema or infection. Electronically Signed   By: Yetta Glassman M.D.   On: 07/22/2021 09:45   CT ABDOMEN PELVIS W CONTRAST  Result Date: 07/21/2021 CLINICAL DATA:  Trauma, fall EXAM: CT ABDOMEN AND PELVIS WITH CONTRAST TECHNIQUE: Multidetector CT imaging of the abdomen and pelvis was performed using the standard protocol following bolus administration of intravenous contrast. RADIATION DOSE REDUCTION: This exam was performed according to the departmental dose-optimization program which includes automated exposure control, adjustment of the mA and/or kV according to patient size and/or use of iterative reconstruction technique. CONTRAST:  178m OMNIPAQUE IOHEXOL 300 MG/ML  SOLN COMPARISON:  None Available. FINDINGS: Lower chest: There are few small nodular densities in both lower lung fields largest measuring 10 mm in size in the left lower lobe. These nodules have been seen in the previous examinations dating as far back as 05/25/2017. Nodule in the left lower lobe measures minimally larger. Coronary artery calcifications are seen. Hepatobiliary: There is fatty  infiltration in the liver. Gallbladder is distended. There is possible minimal amount of fluid adjacent to the inferior margin of the liver. There is slight motion artifact adjacent  to the liver and gallbladder limiting evaluation. In the delayed images, there is no definite wall thickening in gallbladder and there is no definite evidence of any fluid adjacent to the liver. Pancreas: There is pancreatic atrophy. No focal abnormality is seen. Spleen: Spleen is enlarged measuring 14.2 cm in maximum diameter. Adrenals/Urinary Tract: Nodularity seen in both adrenals has not changed significantly since 11/14/2019. There is no hydronephrosis. There are no renal or ureteral stones. Urinary bladder is unremarkable. Stomach/Bowel: There is moderate sized hiatal hernia. Stomach is not distended. Small bowel loops are not dilated. Appendix is not distinctly seen. There is no pericecal inflammation. There is no significant wall thickening in colon. There is no pericolic stranding. Scattered diverticula are seen in colon without signs of focal diverticulitis. Vascular/Lymphatic: Arterial calcifications are seen. Reproductive: Prostate is enlarged with inhomogeneous attenuation. Other: There is no ascites or pneumoperitoneum. Left inguinal hernia containing fat is seen. Musculoskeletal: Degenerative changes are noted in the lumbar spine with disc space narrowing, bony spurs and facet hypertrophy. IMPRESSION: There is no evidence of intestinal obstruction or pneumoperitoneum. There is no hydronephrosis. There are few nodules in both lower lung fields. Nodule in the left lower lobe appears minimally more prominent. However, the nodules have been noted in the previous studies dating as far back as 05/25/2017, most likely suggesting benign etiology. Fatty liver. Moderate sized hiatal hernia. Enlarged spleen. Diverticulosis of colon without signs of diverticulitis. Enlarged prostate. Gallbladder is distended without definite wall  thickening. There is no dilation of bile ducts. In the some of the images motion artifacts limit evaluation of liver and gallbladder. If there are any focal symptoms in the right upper quadrant, follow-up sonogram may be considered. Other findings as described in the body of the report. Electronically Signed   By: Elmer Picker M.D.   On: 07/21/2021 21:04   CT HEAD WO CONTRAST (5MM)  Result Date: 07/21/2021 CLINICAL DATA:  Altered mental status, patient found down on floor. EXAM: CT HEAD WITHOUT CONTRAST TECHNIQUE: Contiguous axial images were obtained from the base of the skull through the vertex without intravenous contrast. RADIATION DOSE REDUCTION: This exam was performed according to the departmental dose-optimization program which includes automated exposure control, adjustment of the mA and/or kV according to patient size and/or use of iterative reconstruction technique. COMPARISON:  None Available. FINDINGS: Brain: Remote lacunar infarcts of the left caudate head and right caudate body. Periventricular white matter and corona radiata hypodensities favor chronic ischemic microvascular white matter disease. Otherwise, the brainstem, cerebellum, cerebral peduncles, thalamus, basal ganglia, basilar cisterns, and ventricular system appear within normal limits. No intracranial hemorrhage, mass lesion, or acute CVA. Vascular: There is atherosclerotic calcification of the cavernous carotid arteries bilaterally. Skull: Unremarkable Sinuses/Orbits: Mild chronic ethmoid and right frontal sinusitis. Other: No supplemental non-categorized findings. IMPRESSION: 1. No acute intracranial findings. 2. Remote lacunar infarcts of the caudate nuclei. 3. Periventricular white matter and corona radiata hypodensities favor chronic ischemic microvascular white matter disease. 4. Atherosclerosis. 5. Mild chronic ethmoid and right frontal sinusitis. Electronically Signed   By: Van Clines M.D.   On: 07/21/2021 18:50    DG Chest Port 1 View  Result Date: 07/21/2021 CLINICAL DATA:  [id number]. AMS; Questionable sepsis; Pt to ED via [facility] from home for fall. Pt was found on the floor this this afternoon by family, unknown when he fell. Non of his morning medications were taken and walker was tipped over in the floor. EXAM: PORTABLE CHEST 1 VIEW COMPARISON:  CT chest 11/14/2019, chest  x-ray 07/30/2017 FINDINGS: The heart and mediastinal contours are unchanged. Aortic calcification. No definite focal consolidation. Biapical pleural/pulmonary scarring. Diffuse chronic interstitial markings with no overt pulmonary edema. No pleural effusion. No pneumothorax. No acute osseous abnormality. IMPRESSION: 1. No active disease. 2. Aortic Atherosclerosis (ICD10-I70.0) and Emphysema (ICD10-J43.9). Electronically Signed   By: Iven Finn M.D.   On: 07/21/2021 18:19    Microbiology: Results for orders placed or performed during the hospital encounter of 07/21/21  Culture, blood (Routine x 2)     Status: None   Collection Time: 07/21/21  5:39 PM   Specimen: BLOOD  Result Value Ref Range Status   Specimen Description BLOOD RIGHT ANTECUBITAL  Final   Special Requests   Final    BOTTLES DRAWN AEROBIC AND ANAEROBIC Blood Culture results may not be optimal due to an excessive volume of blood received in culture bottles   Culture   Final    NO GROWTH 5 DAYS Performed at [facility], [address] [place], [place] [address]    Report Status 07/26/2021 FINAL  Final  Culture, blood (Routine x 2)     Status: None   Collection Time: 07/21/21  5:40 PM   Specimen: BLOOD  Result Value Ref Range Status   Specimen Description BLOOD LEFT ANTECUBITAL  Final   Special Requests   Final    BOTTLES DRAWN AEROBIC AND ANAEROBIC Blood Culture results may not be optimal due to an inadequate volume of blood received in culture bottles   Culture   Final    NO GROWTH 5 DAYS Performed at [facility], [address]  [place], [place] [address]    Report Status 07/26/2021 FINAL  Final    Labs: CBC: Recent Labs  Lab 07/24/21 0411  WBC 7.7  HGB 10.6*  HCT 33.7*  MCV 86.2  PLT 703*   Basic Metabolic Panel: Recent Labs  Lab 07/24/21 0411 07/25/21 0539 07/26/21 0643 07/27/21 0516 07/28/21 0406  NA 139 136 138 135 135  K 3.8 3.4* 4.0 3.7 3.4*  CL 105 103 104 102 101  CO2 '28 26 26 25 26  '$ GLUCOSE 119* 113* 125* 109* 111*  BUN 33* 32* 30* 31* 36*  CREATININE 1.09 1.12 1.12 1.26* 1.39*  CALCIUM 8.7* 9.0 8.9 8.9 8.7*  MG  --   --  2.0 2.1 2.2   Liver Function Tests: No results for input(s): "AST", "ALT", "ALKPHOS", "BILITOT", "PROT", "ALBUMIN" in the last 168 hours. CBG: No results for input(s): "GLUCAP" in the last 168 hours.  Discharge time spent: greater than 30 minutes.  Signed: Max Sane, MD [facility] 07/30/2021

## 2021-08-17 ENCOUNTER — Ambulatory Visit: Payer: Medicare Other | Admitting: Medical

## 2021-09-23 NOTE — Telephone Encounter (Signed)
Patient cancelled due to hospice care.

## 2022-04-14 ENCOUNTER — Other Ambulatory Visit: Payer: Self-pay

## 2022-04-14 ENCOUNTER — Emergency Department
Admission: EM | Admit: 2022-04-14 | Discharge: 2022-04-14 | Disposition: A | Discharge status: Home / Self Care | Attending: Emergency Medicine | Admitting: Emergency Medicine

## 2022-04-14 ENCOUNTER — Emergency Department

## 2022-04-14 DIAGNOSIS — Z20822 Contact with and (suspected) exposure to covid-19: Secondary | ICD-10-CM | POA: Diagnosis not present

## 2022-04-14 DIAGNOSIS — R079 Chest pain, unspecified: Secondary | ICD-10-CM | POA: Insufficient documentation

## 2022-04-14 LAB — CBC WITH DIFFERENTIAL/PLATELET
Abs Immature Granulocytes: 0.03 10*3/uL (ref 0.00–0.07)
Basophils Absolute: 0 10*3/uL (ref 0.0–0.1)
Basophils Relative: 0 %
Eosinophils Absolute: 0.2 10*3/uL (ref 0.0–0.5)
Eosinophils Relative: 3 %
HCT: 37.5 % — ABNORMAL LOW (ref 39.0–52.0)
Hemoglobin: 11.6 g/dL — ABNORMAL LOW (ref 13.0–17.0)
Immature Granulocytes: 0 %
Lymphocytes Relative: 14 %
Lymphs Abs: 1 10*3/uL (ref 0.7–4.0)
MCH: 25.4 pg — ABNORMAL LOW (ref 26.0–34.0)
MCHC: 30.9 g/dL (ref 30.0–36.0)
MCV: 82.2 fL (ref 80.0–100.0)
Monocytes Absolute: 0.5 10*3/uL (ref 0.1–1.0)
Monocytes Relative: 6 %
Neutro Abs: 5.7 10*3/uL (ref 1.7–7.7)
Neutrophils Relative %: 77 %
Platelets: 156 10*3/uL (ref 150–400)
RBC: 4.56 MIL/uL (ref 4.22–5.81)
RDW: 16.6 % — ABNORMAL HIGH (ref 11.5–15.5)
WBC: 7.5 10*3/uL (ref 4.0–10.5)
nRBC: 0 % (ref 0.0–0.2)

## 2022-04-14 LAB — BASIC METABOLIC PANEL
Anion gap: 10 (ref 5–15)
BUN: 23 mg/dL (ref 8–23)
CO2: 27 mmol/L (ref 22–32)
Calcium: 9.6 mg/dL (ref 8.9–10.3)
Chloride: 102 mmol/L (ref 98–111)
Creatinine, Ser: 1.38 mg/dL — ABNORMAL HIGH (ref 0.61–1.24)
GFR, Estimated: 48 mL/min — ABNORMAL LOW (ref >60)
Glucose, Bld: 127 mg/dL — ABNORMAL HIGH (ref 70–99)
Potassium: 4.3 mmol/L (ref 3.5–5.1)
Sodium: 139 mmol/L (ref 135–145)

## 2022-04-14 LAB — RESP PANEL BY RT-PCR (RSV, FLU A&B, COVID)  RVPGX2
Influenza A by PCR: NEGATIVE
Influenza B by PCR: NEGATIVE
Resp Syncytial Virus by PCR: NEGATIVE
SARS Coronavirus 2 by RT PCR: NEGATIVE

## 2022-04-14 LAB — BRAIN NATRIURETIC PEPTIDE: B Natriuretic Peptide: 1109.3 pg/mL — ABNORMAL HIGH (ref 0.0–100.0)

## 2022-04-14 LAB — PROCALCITONIN: Procalcitonin: <0.1 ng/mL

## 2022-04-14 LAB — TROPONIN I (HIGH SENSITIVITY)
Troponin I (High Sensitivity): 26 ng/L — ABNORMAL HIGH (ref <18)
Troponin I (High Sensitivity): 22 ng/L — ABNORMAL HIGH (ref <18)

## 2022-04-14 MED ORDER — TRAMADOL HCL 50 MG PO TABS: 50.0000 mg | ORAL_TABLET | Freq: Four times a day (QID) | ORAL | 0 refills | Status: DC | PRN

## 2022-04-14 NOTE — Progress Notes (Signed)
Manufacturing engineer [facility])       This patient is a current hospice patient with [facility], admitted with a terminal diagnosis Hypertensive heart disease with combined systolic/diastolic HF with exacerbation and stage 3a CKD   [facility] will continue to follow for any discharge planning needs and to coordinate continuation of hospice care.     Please call with any questions/concerns.    Thank you for the opportunity to participate in this patient's care.  Daphene Calamity, MSW [facility] Liaison  [phone]

## 2022-04-14 NOTE — ED Triage Notes (Signed)
EMS states they were called out for chest pain, caregiver administered 1 SL NTG; EMS gave 367m of ASA. Patient altered at baseline.

## 2022-04-14 NOTE — ED Notes (Signed)
Pt Dc to home. Dc instructions reviewed with daughter. Iv removed, cath intact, pressure dressing applied. No bleeding noted at site. Pt assisted out of dept via wheelchair by Jenny Reichmann, EDT

## 2022-04-14 NOTE — ED Provider Notes (Signed)
[facility] Provider Note    Event Date/Time   First MD Initiated Contact with Patient 04/14/22 [phone]     (approximate)   History   Chest Pain   HPI  Anthony Johns is a 89 y.o. male brought to the emergency department by EMS today for chest pain.  Unfortunately the patient is unable to give any significant history.  He says he does have chest pain but is unable to say how long.  He stated that he was here 2 weeks ago for similar symptoms although I find no record of this in the computer system.  He denied any shortness of breath.     Physical Exam   Triage Vital Signs: ED Triage Vitals  Enc Vitals Group     BP      Pulse      Resp      Temp      Temp src      SpO2      Weight      Height      Head Circumference      Peak Flow      Pain Score      Pain Loc      Pain Edu?      Excl. in [place]?     Most recent vital signs: There were no vitals filed for this visit.   General: Awake, alert, not completely oriented. CV:  Good peripheral perfusion. Regular rate and rhythm. Resp:  Normal effort. Lungs clear. Abd:  No distention.     ED Results / Procedures / Treatments   Labs (all labs ordered are listed, but only abnormal results are displayed) Labs Reviewed  CBC WITH DIFFERENTIAL/PLATELET - Abnormal; Notable for the following components:      Result Value   Hemoglobin 11.6 (*)    HCT 37.5 (*)    MCH 25.4 (*)    RDW 16.6 (*)    All other components within normal limits  BASIC METABOLIC PANEL - Abnormal; Notable for the following components:   Glucose, Bld 127 (*)    Creatinine, Ser 1.38 (*)    GFR, Estimated 48 (*)    All other components within normal limits  BRAIN NATRIURETIC PEPTIDE - Abnormal; Notable for the following components:   B Natriuretic Peptide 1,109.3 (*)    All other components within normal limits  TROPONIN I (HIGH SENSITIVITY) - Abnormal; Notable for the following components:   Troponin I (High Sensitivity) 26  (*)    All other components within normal limits  TROPONIN I (HIGH SENSITIVITY) - Abnormal; Notable for the following components:   Troponin I (High Sensitivity) 22 (*)    All other components within normal limits  RESP PANEL BY RT-PCR (RSV, FLU A&B, COVID)  [id number]  PROCALCITONIN     EKG  I, Nance Pear, attending physician, personally viewed and interpreted this EKG  EKG Time: 1616 Rate: 52 Rhythm: atrial fibrillation Axis: normal Intervals: qtc 391 QRS: LBBB ST changes: no st elevation Impression: abnormal ekg   RADIOLOGY I independently interpreted and visualized the CXR. My interpretation: Bilateral opacities. Radiology interpretation: IMPRESSION:  Nonspecific interstitial prominence consistent with atypical  infection, edema or interstitial lung disease. No focal  consolidation.     PROCEDURES:  Critical Care performed: No  Procedures   MEDICATIONS ORDERED IN ED: Medications - No data to display   IMPRESSION / MDM / [place] / ED COURSE  I reviewed the triage vital signs  and the nursing notes.                              Differential diagnosis includes, but is not limited to, pneumonia, ACS, pneumothorax  Patient's presentation is most consistent with acute presentation with potential threat to life or bodily function.   Patient presented to the emergency department today because of concerns for chest pain.  Initial troponin was very minimally elevated.  Repeat did show some improvement.  At this time I have low suspicion for ACS.  Chest x-ray was concerning for possible atypical infection however patient without elevation of procalcitonin.  At this time I think very unlikely to be bacterial.  BNP was elevated.  I did have a discussion with the patient's daughter about this.  Apparently the patient has not been on Lasix for quite some time.  It sounds like they would have a hard time initiating Lasix in addition to the medicine he is already  taking.  Will plan on discharging.  Patient's family does have some Lasix left over several continue to evaluate his breathing. Will give patient prescription for pain medication.   FINAL CLINICAL IMPRESSION(S) / ED DIAGNOSES   Final diagnoses:  Nonspecific chest pain     Note:  This document was prepared using Dragon voice recognition software and may include unintentional dictation errors.    Nance Pear, MD 04/14/22 2100

## 2022-04-14 NOTE — Discharge Instructions (Signed)
Please seek medical attention for any high fevers, chest pain, shortness of breath, change in behavior, persistent vomiting, bloody stool or any other new or concerning symptoms.  

## 2022-05-23 DEATH — deceased

## 2024-02-24 IMAGING — CT CT HEAD W/O CM
4 series · 16 of 47 positions shown, 18 images · non-contrast
Comparison: None Available.

CLINICAL DATA: Altered mental status, patient found down on floor.



[Series 2: head wo · axial · 0.43mm/px · z∈[+327,+452]mm · 7 of 35 slices shown, 9 images]
[im 5/35  brain]
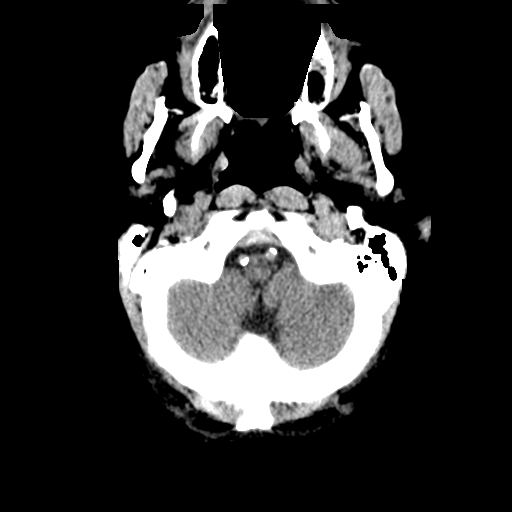
[im 5/35  bone]
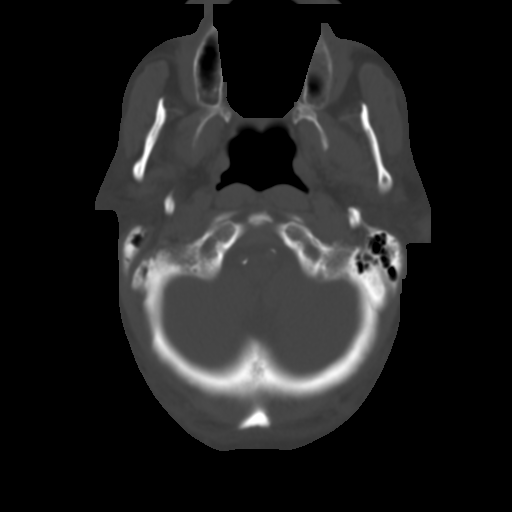
[im 9/35  brain]
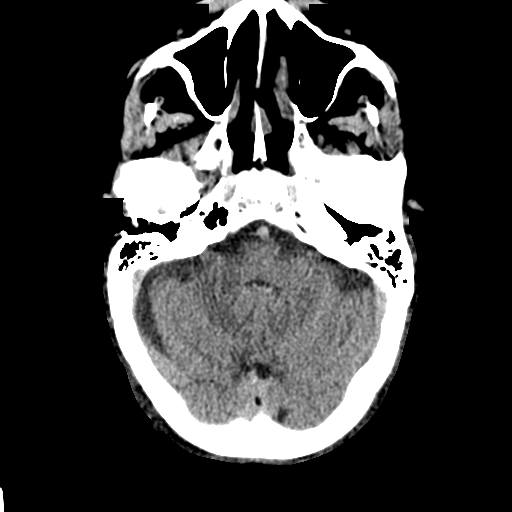
[im 13/35  brain]
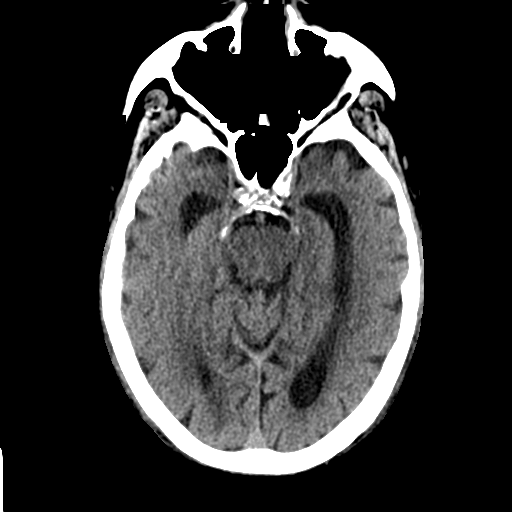
[im 18/35  brain]
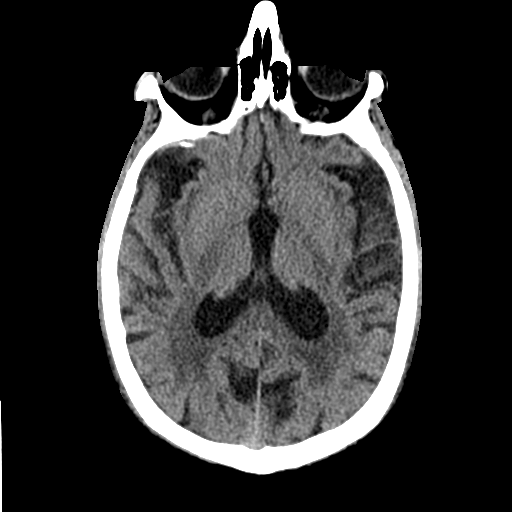
[im 22/35  brain]
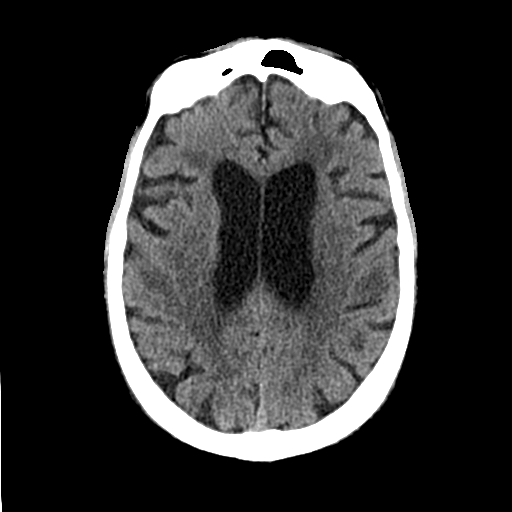
[im 22/35  bone]
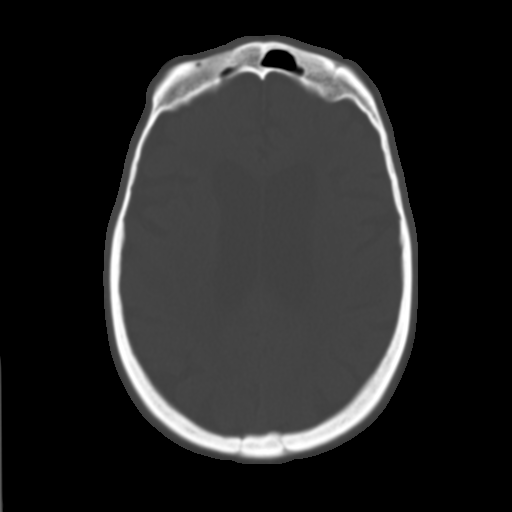
[im 26/35  brain]
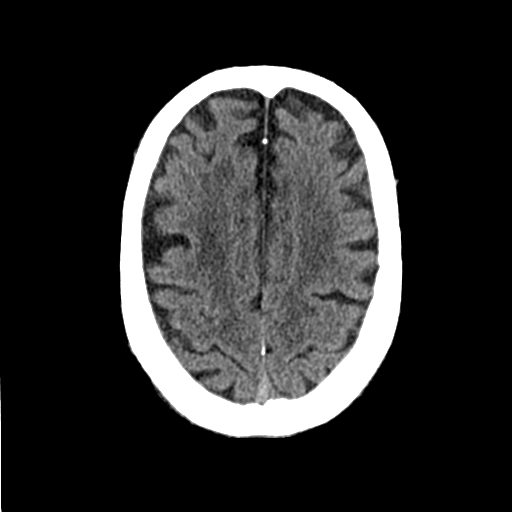
[im 30/35  brain]
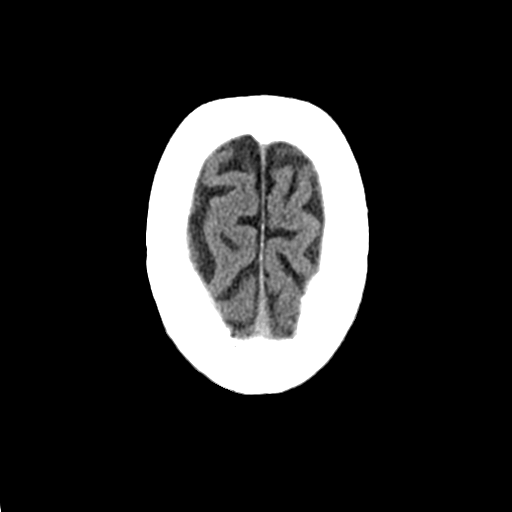

[Series 3: head bone · axial · 0.43mm/px · z∈[+323,+357]mm · 3 of 87 slices shown]
[im 9/87  bone]
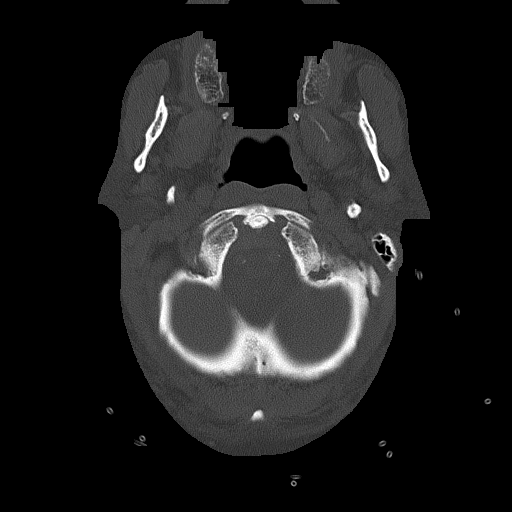
[im 18/87  bone]
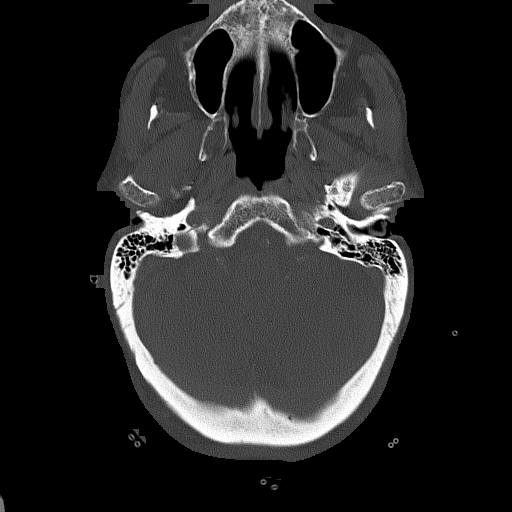
[im 26/87  bone]
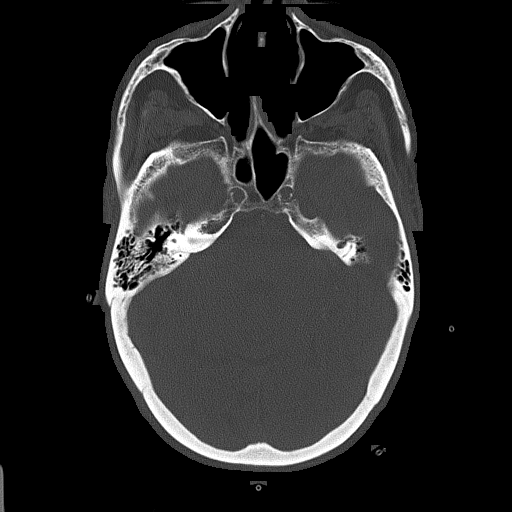

[Series 4: coronal soft tissue · coronal · 0.33mm/px · 3 of 71 slices shown]
[im 24/71  brain]
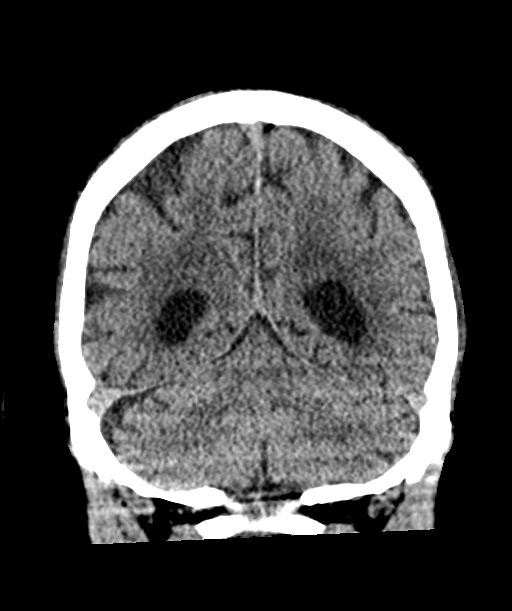
[im 32/71  brain]
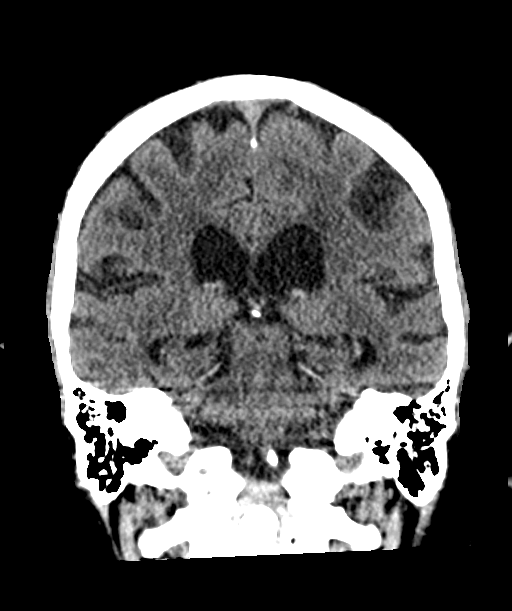
[im 39/71  brain]
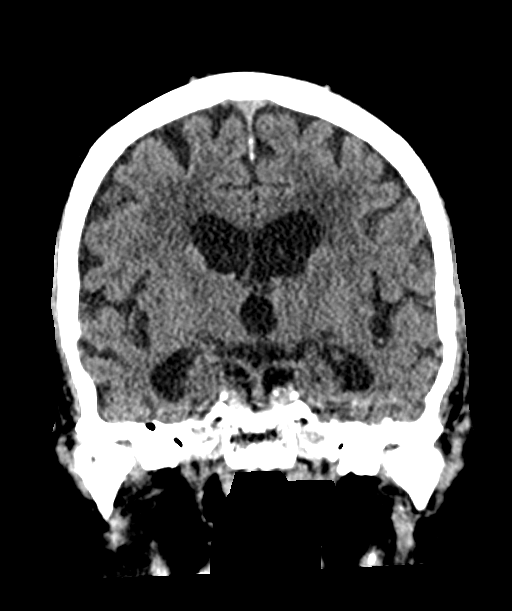

[Series 5: sagittal soft tissue · sagittal · 0.40mm/px · 3 of 54 slices shown]
[im 18/54  brain]
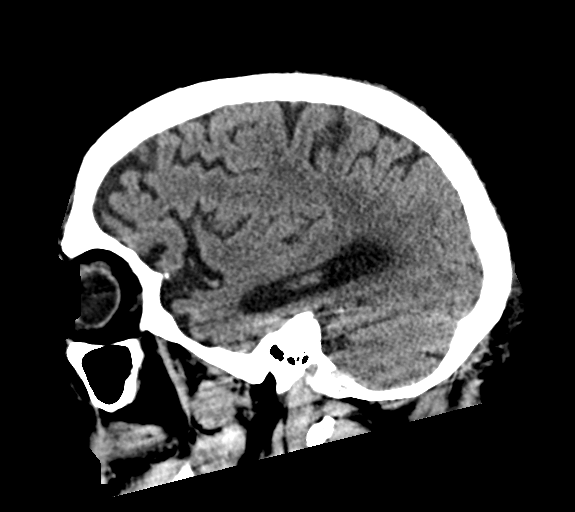
[im 27/54  brain]
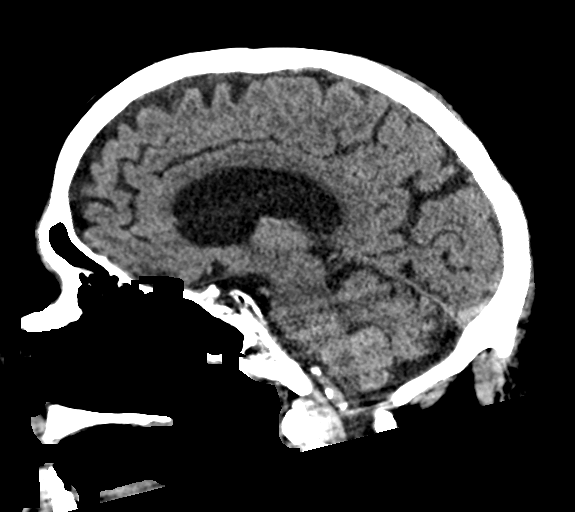
[im 36/54  brain]
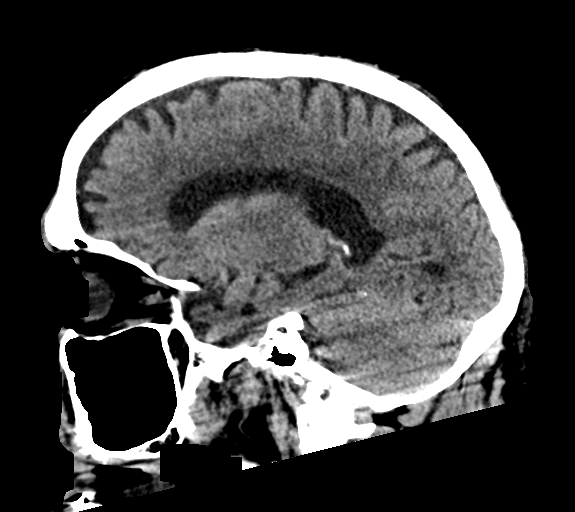

[16 of 47 positions shown; findings below may reference images not displayed]

FINDINGS: Brain: Remote lacunar infarcts of the left caudate head and right
caudate body. Periventricular white matter and corona radiata
hypodensities favor chronic ischemic microvascular white matter
disease.

Otherwise, the brainstem, cerebellum, cerebral peduncles, thalamus,
basal ganglia, basilar cisterns, and ventricular system appear
within normal limits. No intracranial hemorrhage, mass lesion, or
acute CVA.

Vascular: There is atherosclerotic calcification of the cavernous
carotid arteries bilaterally.

Skull: Unremarkable

Sinuses/Orbits: Mild chronic ethmoid and right frontal sinusitis.

Other: No supplemental non-categorized findings.
IMPRESSION: 1. No acute intracranial findings.
2. Remote lacunar infarcts of the caudate nuclei.
3. Periventricular white matter and corona radiata hypodensities
favor chronic ischemic microvascular white matter disease.
4. Atherosclerosis.
5. Mild chronic ethmoid and right frontal sinusitis.

## 2024-02-25 IMAGING — DX DG CHEST 1V PORT
1 series · 1 of 1 positions shown · non-contrast
Comparison: Chest x-ray dated July 21, 2021

CLINICAL DATA: Shortness of breath

EXAM:
PORTABLE CHEST 1 VIEW

[chest ap]
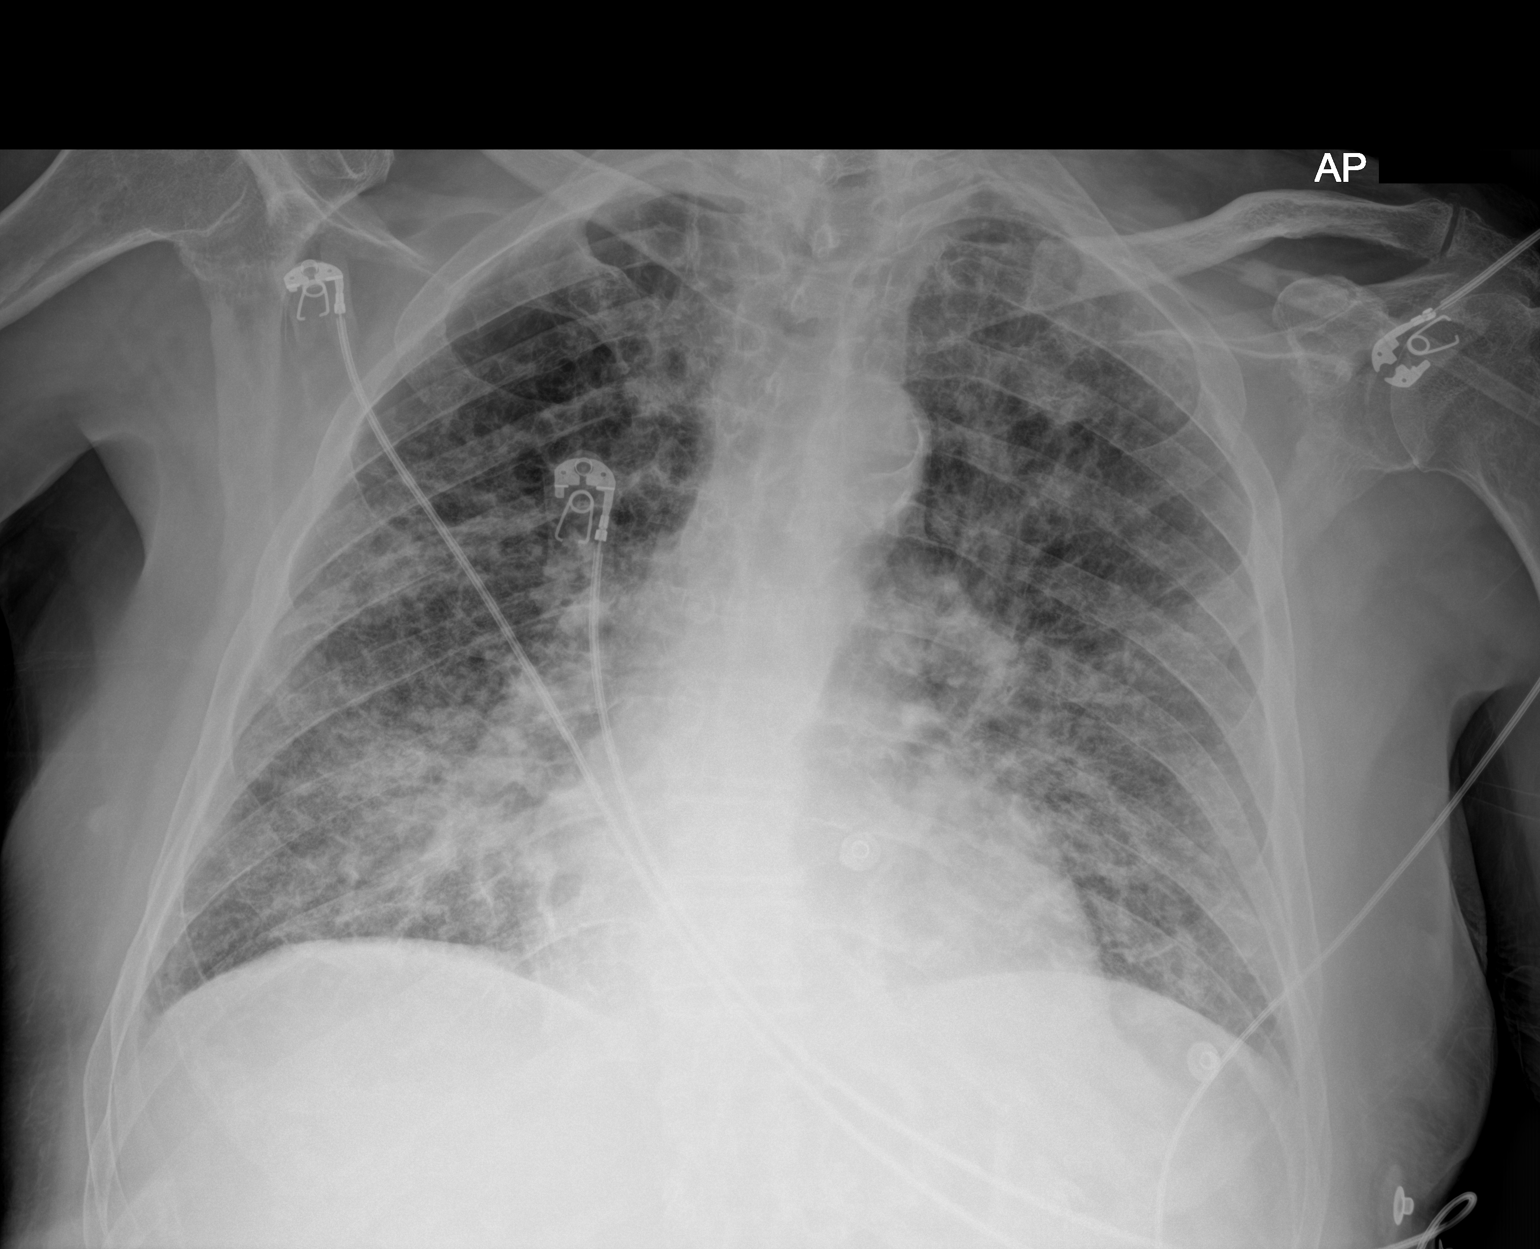

[1 of 1 positions shown; findings below may reference images not displayed]

FINDINGS: Cardiac and mediastinal contours are unchanged. New bilateral
heterogeneous opacities with more focal consolidation of the right
lower lobe. No large pleural effusion or pneumothorax.
IMPRESSION: New bilateral heterogeneous opacities with more focal consolidation
of the right lower lobe, differential considerations include
pulmonary edema or infection.
